# Patient Record
Sex: Female | Born: 1940 | Race: White | Hispanic: No | Smoking: Former smoker
Health system: Southern US, Community
[De-identification: ages and names within clinical notes are randomized; demographics above are authoritative.]

## PROBLEM LIST (undated history)

## (undated) DIAGNOSIS — C801 Malignant (primary) neoplasm, unspecified: Secondary | ICD-10-CM

## (undated) DIAGNOSIS — I10 Essential (primary) hypertension: Secondary | ICD-10-CM

## (undated) DIAGNOSIS — Z9889 Other specified postprocedural states: Secondary | ICD-10-CM

## (undated) DIAGNOSIS — R112 Nausea with vomiting, unspecified: Secondary | ICD-10-CM

## (undated) HISTORY — PX: ABDOMINAL HYSTERECTOMY: SHX81

## (undated) HISTORY — DX: Essential (primary) hypertension: I10

---

## 2005-04-01 ENCOUNTER — Ambulatory Visit: Payer: Self-pay | Admitting: Internal Medicine

## 2006-05-10 ENCOUNTER — Ambulatory Visit: Payer: Self-pay | Admitting: Internal Medicine

## 2007-05-17 ENCOUNTER — Ambulatory Visit: Payer: Self-pay | Admitting: Internal Medicine

## 2008-05-21 ENCOUNTER — Ambulatory Visit: Payer: Self-pay | Admitting: Internal Medicine

## 2009-06-04 ENCOUNTER — Ambulatory Visit: Payer: Self-pay | Admitting: Internal Medicine

## 2010-06-26 ENCOUNTER — Ambulatory Visit: Payer: Self-pay | Admitting: Internal Medicine

## 2011-07-08 ENCOUNTER — Ambulatory Visit: Payer: Self-pay | Admitting: Family Medicine

## 2012-03-03 ENCOUNTER — Emergency Department: Payer: Self-pay | Admitting: Emergency Medicine

## 2012-03-03 LAB — URINALYSIS, COMPLETE
Bacteria: NONE SEEN
Bilirubin,UR: NEGATIVE
Glucose,UR: NEGATIVE mg/dL (ref 0–75)
Nitrite: NEGATIVE
Ph: 6 (ref 4.5–8.0)
RBC,UR: 324 /HPF (ref 0–5)
Specific Gravity: 1.019 (ref 1.003–1.030)
WBC UR: 1 /HPF (ref 0–5)

## 2012-03-03 LAB — COMPREHENSIVE METABOLIC PANEL
Alkaline Phosphatase: 54 U/L (ref 50–136)
Anion Gap: 8 (ref 7–16)
Bilirubin,Total: 0.3 mg/dL (ref 0.2–1.0)
Calcium, Total: 9 mg/dL (ref 8.5–10.1)
Chloride: 106 mmol/L (ref 98–107)
Creatinine: 1.1 mg/dL (ref 0.60–1.30)
EGFR (African American): 59 — ABNORMAL LOW
EGFR (Non-African Amer.): 51 — ABNORMAL LOW
Glucose: 155 mg/dL — ABNORMAL HIGH (ref 65–99)
Osmolality: 286 (ref 275–301)
SGOT(AST): 21 U/L (ref 15–37)
Sodium: 141 mmol/L (ref 136–145)
Total Protein: 7.2 g/dL (ref 6.4–8.2)

## 2012-03-03 LAB — CBC
MCH: 31.3 pg (ref 26.0–34.0)
MCHC: 32.9 g/dL (ref 32.0–36.0)
Platelet: 174 10*3/uL (ref 150–440)
RBC: 4.17 10*6/uL (ref 3.80–5.20)
WBC: 8.9 10*3/uL (ref 3.6–11.0)

## 2012-07-10 ENCOUNTER — Ambulatory Visit: Payer: Self-pay | Admitting: Family Medicine

## 2013-03-14 ENCOUNTER — Ambulatory Visit: Payer: Self-pay | Admitting: Vascular Surgery

## 2013-10-18 ENCOUNTER — Ambulatory Visit: Payer: Self-pay | Admitting: Family Medicine

## 2014-11-05 ENCOUNTER — Ambulatory Visit: Payer: Self-pay | Admitting: Family Medicine

## 2015-10-21 ENCOUNTER — Other Ambulatory Visit: Payer: Self-pay | Admitting: Family Medicine

## 2015-10-21 DIAGNOSIS — Z1231 Encounter for screening mammogram for malignant neoplasm of breast: Secondary | ICD-10-CM

## 2015-11-07 ENCOUNTER — Ambulatory Visit: Payer: Self-pay

## 2015-11-12 ENCOUNTER — Other Ambulatory Visit: Payer: Self-pay | Admitting: Family Medicine

## 2015-11-12 ENCOUNTER — Ambulatory Visit
Admission: RE | Admit: 2015-11-12 | Discharge: 2015-11-12 | Disposition: A | Discharge status: Home / Self Care | Payer: Medicare Other | Source: Ambulatory Visit | Attending: Family Medicine | Admitting: Family Medicine

## 2015-11-12 DIAGNOSIS — Z1231 Encounter for screening mammogram for malignant neoplasm of breast: Secondary | ICD-10-CM | POA: Diagnosis present

## 2016-11-08 ENCOUNTER — Other Ambulatory Visit: Payer: Self-pay | Admitting: Family Medicine

## 2016-11-08 DIAGNOSIS — Z1231 Encounter for screening mammogram for malignant neoplasm of breast: Secondary | ICD-10-CM

## 2016-12-03 ENCOUNTER — Ambulatory Visit
Admission: RE | Admit: 2016-12-03 | Discharge: 2016-12-03 | Disposition: A | Discharge status: Home / Self Care | Payer: Medicare Other | Source: Ambulatory Visit | Attending: Family Medicine | Admitting: Family Medicine

## 2016-12-03 DIAGNOSIS — Z1231 Encounter for screening mammogram for malignant neoplasm of breast: Secondary | ICD-10-CM

## 2017-05-02 ENCOUNTER — Other Ambulatory Visit (INDEPENDENT_AMBULATORY_CARE_PROVIDER_SITE_OTHER): Payer: Self-pay | Admitting: Vascular Surgery

## 2017-05-02 DIAGNOSIS — I728 Aneurysm of other specified arteries: Secondary | ICD-10-CM

## 2017-05-03 ENCOUNTER — Encounter (INDEPENDENT_AMBULATORY_CARE_PROVIDER_SITE_OTHER): Payer: Self-pay | Admitting: Vascular Surgery

## 2017-05-03 ENCOUNTER — Ambulatory Visit (INDEPENDENT_AMBULATORY_CARE_PROVIDER_SITE_OTHER): Payer: Medicare Other | Admitting: Vascular Surgery

## 2017-05-03 ENCOUNTER — Ambulatory Visit (INDEPENDENT_AMBULATORY_CARE_PROVIDER_SITE_OTHER): Payer: Medicare Other

## 2017-05-03 VITALS — BP 161/82 | HR 62 | Resp 17 | Wt 148.8 lb

## 2017-05-03 DIAGNOSIS — I10 Essential (primary) hypertension: Secondary | ICD-10-CM

## 2017-05-03 DIAGNOSIS — I728 Aneurysm of other specified arteries: Secondary | ICD-10-CM

## 2017-05-03 NOTE — Assessment & Plan Note (Signed)
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 This has been stable and she is doing well. She is beyond childbearing years. No intervention is required for the small splenic artery aneurysm at this time. I'm going to lengthen her follow up to 18 months with duplex.

## 2017-05-03 NOTE — Assessment & Plan Note (Signed)
blood pressure control important in reducing the progression of atherosclerotic disease and aneurysmal degeneration. On appropriate oral medications.  

## 2017-05-03 NOTE — Progress Notes (Signed)
MRN : [id number]  Courtney Jefferson is a 76 y.o. (Apr 14, 1941) female who presents with chief complaint of  Chief Complaint  Patient presents with  . Follow-up  .  History of Present Illness: Patient returns today in follow up of Splenic artery aneurysm. She is doing well without complaints today. She denies abdominal pain, back pain, or other worrisome symptoms. 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  Current Outpatient Prescriptions  Medication Sig Dispense Refill  . BLACK COHOSH PO Take by mouth.    . calcium carbonate (OS-CAL) 600 MG TABS tablet Take by mouth.    . cholecalciferol (VITAMIN D) 1000 units tablet Take 1,000 Units by mouth daily.    Marland Kitchen triamterene-hydrochlorothiazide (MAXZIDE-25) 37.5-25 MG tablet TAKE 1 TABLET BY MOUTH ONCE A DAY     No current facility-administered medications for this visit.     Past Medical History:  Diagnosis Date  . Hypertension     Past Surgical History:  Procedure Laterality Date  . ABDOMINAL HYSTERECTOMY     partial    Social History Social History  Substance Use Topics  . Smoking status: Former Research scientist (life sciences)  . Smokeless tobacco: Never Used  . Alcohol use No    Family History No bleeding or clotting disorders  No Known Allergies   REVIEW OF SYSTEMS (Negative unless checked)  Constitutional: [] Weight loss  [] Fever  [] Chills Cardiac: [] Chest pain   [] Chest pressure   [] Palpitations   [] Shortness of breath when laying flat   [] Shortness of breath at rest   [] Shortness of breath with exertion. Vascular:  [] Pain in legs with walking   [] Pain in legs at rest   [] Pain in legs when laying flat   [] Claudication   [] Pain in feet when walking  [] Pain in feet at rest  [] Pain in feet when laying flat   [] History of DVT   [] Phlebitis   [] Swelling in legs   [] Varicose veins   [] Non-healing ulcers Pulmonary:    [] Uses home oxygen   [] Productive cough   [] Hemoptysis   [] Wheeze  [] COPD   [] Asthma Neurologic:  [] Dizziness  [] Blackouts   [] Seizures   [] History of stroke   [] History of TIA  [] Aphasia   [] Temporary blindness   [] Dysphagia   [] Weakness or numbness in arms   [] Weakness or numbness in legs Musculoskeletal:  [x] Arthritis   [] Joint swelling   [] Joint pain   [] Low back pain Hematologic:  [] Easy bruising  [] Easy bleeding   [] Hypercoagulable state   [] Anemic   Gastrointestinal:  [] Blood in stool   [] Vomiting blood  [] Gastroesophageal reflux/heartburn   [] Abdominal pain Genitourinary:  [] Chronic kidney disease   [] Difficult urination  [] Frequent urination  [] Burning with urination   [] Hematuria Skin:  [] Rashes   [] Ulcers   [] Wounds Psychological:  [] History of anxiety   []  History of major depression.  Physical Examination  BP (!) 161/82   Pulse 62   Resp 17   Wt 148 lb 12.8 oz (67.5 kg)  Gen:  WD/WN, NAD Head: [place]/AT, No temporalis wasting. Ear/Nose/Throat: Hearing grossly intact, nares w/o erythema or drainage, trachea midline Eyes: Conjunctiva clear. Sclera non-icteric Neck: Supple.  No JVD.  Pulmonary:  Good air movement, no use of accessory muscles.  Cardiac: RRR, normal S1, S2 Vascular:  Vessel Right Left  Radial Palpable Palpable  Gastrointestinal: soft, non-tender/non-distended. No guarding/reflex.  Musculoskeletal: M/S 5/5 throughout.  No deformity or atrophy.  Neurologic: Sensation grossly intact in extremities.  Symmetrical.  Speech is fluent.  Psychiatric: Judgment intact, Mood & affect appropriate for pt's clinical situation. Dermatologic: No rashes or ulcers noted.  No cellulitis or open wounds.       Labs No results found for this or any previous visit (from the past 2160 hour(s)).  Radiology No results found.  Assessment/Plan  Hypertension blood pressure control important in reducing the progression of atherosclerotic  disease and aneurysmal degeneration. On appropriate oral medications.   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 This has been stable and she is doing well. She is beyond childbearing years. No intervention is required for the small splenic artery aneurysm at this time. I'm going to lengthen her follow up to 18 months with duplex.    Leotis Pain, MD  05/03/2017 8:51 AM    This note was created with Dragon medical transcription system.  Any errors from dictation are purely unintentional

## 2017-12-21 ENCOUNTER — Other Ambulatory Visit: Payer: Self-pay | Admitting: Family Medicine

## 2017-12-21 DIAGNOSIS — Z1231 Encounter for screening mammogram for malignant neoplasm of breast: Secondary | ICD-10-CM

## 2018-01-05 ENCOUNTER — Ambulatory Visit
Admission: RE | Admit: 2018-01-05 | Discharge: 2018-01-05 | Disposition: A | Discharge status: Home / Self Care | Payer: Medicare Other | Source: Ambulatory Visit | Attending: Family Medicine | Admitting: Family Medicine

## 2018-01-05 DIAGNOSIS — Z1231 Encounter for screening mammogram for malignant neoplasm of breast: Secondary | ICD-10-CM | POA: Diagnosis not present

## 2018-03-15 ENCOUNTER — Emergency Department
Admission: EM | Admit: 2018-03-15 | Discharge: 2018-03-15 | Disposition: A | Discharge status: Home / Self Care | Payer: Medicare Other | Attending: Emergency Medicine | Admitting: Emergency Medicine

## 2018-03-15 ENCOUNTER — Emergency Department: Payer: Medicare Other

## 2018-03-15 ENCOUNTER — Other Ambulatory Visit: Payer: Self-pay

## 2018-03-15 ENCOUNTER — Encounter: Payer: Self-pay | Admitting: Emergency Medicine

## 2018-03-15 DIAGNOSIS — B3731 Acute candidiasis of vulva and vagina: Secondary | ICD-10-CM

## 2018-03-15 DIAGNOSIS — B373 Candidiasis of vulva and vagina: Secondary | ICD-10-CM | POA: Insufficient documentation

## 2018-03-15 DIAGNOSIS — I10 Essential (primary) hypertension: Secondary | ICD-10-CM | POA: Diagnosis not present

## 2018-03-15 DIAGNOSIS — N201 Calculus of ureter: Secondary | ICD-10-CM | POA: Diagnosis not present

## 2018-03-15 DIAGNOSIS — Z79899 Other long term (current) drug therapy: Secondary | ICD-10-CM | POA: Insufficient documentation

## 2018-03-15 DIAGNOSIS — R109 Unspecified abdominal pain: Secondary | ICD-10-CM

## 2018-03-15 DIAGNOSIS — Z87891 Personal history of nicotine dependence: Secondary | ICD-10-CM | POA: Insufficient documentation

## 2018-03-15 LAB — URINALYSIS, COMPLETE (UACMP) WITH MICROSCOPIC
BILIRUBIN URINE: NEGATIVE
Glucose, UA: NEGATIVE mg/dL
Ketones, ur: NEGATIVE mg/dL
Leukocytes, UA: NEGATIVE
Nitrite: NEGATIVE
PROTEIN: NEGATIVE mg/dL
RBC / HPF: >50 RBC/hpf — ABNORMAL HIGH (ref 0–5)
SPECIFIC GRAVITY, URINE: 1.032 — AB (ref 1.005–1.030)
WBC UA: NONE SEEN WBC/hpf (ref 0–5)
pH: 5 (ref 5.0–8.0)

## 2018-03-15 LAB — CBC WITH DIFFERENTIAL/PLATELET
BASOS ABS: 0.1 10*3/uL (ref 0–0.1)
Basophils Relative: 1 %
EOS PCT: 1 %
Eosinophils Absolute: 0.1 10*3/uL (ref 0–0.7)
HCT: 40.6 % (ref 35.0–47.0)
HEMOGLOBIN: 13.8 g/dL (ref 12.0–16.0)
LYMPHS ABS: 1.3 10*3/uL (ref 1.0–3.6)
Lymphocytes Relative: 17 %
MCH: 31.8 pg (ref 26.0–34.0)
MCHC: 34.1 g/dL (ref 32.0–36.0)
MCV: 93.3 fL (ref 80.0–100.0)
Monocytes Absolute: 0.4 10*3/uL (ref 0.2–0.9)
Monocytes Relative: 5 %
NEUTROS PCT: 76 %
Neutro Abs: 6.1 10*3/uL (ref 1.4–6.5)
PLATELETS: 169 10*3/uL (ref 150–440)
RBC: 4.35 MIL/uL (ref 3.80–5.20)
RDW: 13.9 % (ref 11.5–14.5)
WBC: 8 10*3/uL (ref 3.6–11.0)

## 2018-03-15 LAB — WET PREP, GENITAL
Clue Cells Wet Prep HPF POC: NONE SEEN
SPERM: NONE SEEN
Trich, Wet Prep: NONE SEEN
Yeast Wet Prep HPF POC: NONE SEEN

## 2018-03-15 LAB — COMPREHENSIVE METABOLIC PANEL
ALT: 18 U/L (ref 14–54)
AST: 21 U/L (ref 15–41)
Albumin: 3.9 g/dL (ref 3.5–5.0)
Alkaline Phosphatase: 51 U/L (ref 38–126)
Anion gap: 8 (ref 5–15)
BUN: 22 mg/dL — AB (ref 6–20)
CALCIUM: 9 mg/dL (ref 8.9–10.3)
CHLORIDE: 107 mmol/L (ref 101–111)
CO2: 23 mmol/L (ref 22–32)
CREATININE: 1.07 mg/dL — AB (ref 0.44–1.00)
GFR calc non Af Amer: 49 mL/min — ABNORMAL LOW (ref >60)
GFR, EST AFRICAN AMERICAN: 57 mL/min — AB (ref >60)
GLUCOSE: 144 mg/dL — AB (ref 65–99)
Potassium: 3.6 mmol/L (ref 3.5–5.1)
SODIUM: 138 mmol/L (ref 135–145)
Total Bilirubin: 0.4 mg/dL (ref 0.3–1.2)
Total Protein: 7.5 g/dL (ref 6.5–8.1)

## 2018-03-15 LAB — LIPASE, BLOOD: Lipase: 42 U/L (ref 11–51)

## 2018-03-15 MED ORDER — MICONAZOLE NITRATE 1200 & 2 MG & % VA KIT: 1.0000 | PACK | Freq: Once | VAGINAL | 0 refills | Status: AC

## 2018-03-15 MED ORDER — KETOROLAC TROMETHAMINE 60 MG/2ML IM SOLN
INTRAMUSCULAR | Status: AC
  Administered 2018-03-15: 15 mg via INTRAMUSCULAR
  Filled 2018-03-15: qty 2

## 2018-03-15 MED ORDER — TAMSULOSIN HCL 0.4 MG PO CAPS
0.4000 mg | ORAL_CAPSULE | ORAL | Status: AC
  Administered 2018-03-15: 0.4 mg via ORAL
  Filled 2018-03-15: qty 1

## 2018-03-15 MED ORDER — ONDANSETRON 4 MG PO TBDP
4.0000 mg | ORAL_TABLET | Freq: Once | ORAL | Status: AC | PRN
  Administered 2018-03-15: 4 mg via ORAL

## 2018-03-15 MED ORDER — ONDANSETRON 4 MG PO TBDP
ORAL_TABLET | ORAL | Status: AC
  Filled 2018-03-15: qty 1

## 2018-03-15 MED ORDER — KETOROLAC TROMETHAMINE 60 MG/2ML IM SOLN
15.0000 mg | Freq: Once | INTRAMUSCULAR | Status: AC
  Administered 2018-03-15: 15 mg via INTRAMUSCULAR

## 2018-03-15 MED ORDER — ONDANSETRON 4 MG PO TBDP: 4.0000 mg | ORAL_TABLET | Freq: Three times a day (TID) | ORAL | 0 refills | Status: DC | PRN

## 2018-03-15 MED ORDER — ACETAMINOPHEN 325 MG PO TABS: 650.0000 mg | ORAL_TABLET | Freq: Four times a day (QID) | ORAL | 0 refills | Status: DC | PRN

## 2018-03-15 NOTE — ED Notes (Signed)
Patient transported to CT 

## 2018-03-15 NOTE — ED Triage Notes (Signed)
Pt arrived with family with complaints of left flank pain and left lower abdominal pain. Pt states pain started today at 0400 and has been nauseated with 1 episode of emesis. Family/pt concerned because of patients history of splenic aneurism. Pt was told by primary doctor to come to ED if she ever had abdominal pain to be evaluated.

## 2018-03-15 NOTE — ED Provider Notes (Signed)
[facility] Emergency Department Provider Note  ____________________________________________  Time seen: Approximately 12:23 PM  I have reviewed the triage vital signs and the nursing notes.   HISTORY  Chief Complaint Abdominal Pain and Flank Pain    HPI Courtney Jefferson is a 77 y.o. female with a history of hypertension and splenic artery aneurysm who complains of left flank pain radiating to the left lower quadrant that started at 4 AM today.  Intermittent episodes lasting a few minutes at a time, no aggravating or alleviating factors, severe and associated with nausea when present.  Feels sharp.  Currently resolved.  Also complains of vaginal irritation which is a recurrent issue for her, previously treated with nystatin cream which was previously effective but currently not helping.      Past Medical History:  Diagnosis Date  . Hypertension      Patient Active Problem List   Diagnosis Date Noted  . Hypertension 05/03/2017  . Splenic artery aneurysm ([place]) 05/03/2017     Past Surgical History:  Procedure Laterality Date  . ABDOMINAL HYSTERECTOMY     partial     Prior to Admission medications   Medication Sig Start Date End Date Taking? Authorizing Provider  acetaminophen (TYLENOL) 325 MG tablet Take 2 tablets (650 mg total) by mouth every 6 (six) hours as needed. 03/15/18   Carrie Mew, MD  BLACK COHOSH PO Take by mouth.    [provider]  calcium carbonate (OS-CAL) 600 MG TABS tablet Take by mouth.    [provider]  cholecalciferol (VITAMIN D) 1000 units tablet Take 1,000 Units by mouth daily.    [provider]  miconazole (MONISTAT 1 COMBO PACK) kit Place 1 each vaginally once for 1 dose. 03/15/18 03/15/18  Carrie Mew, MD  ondansetron (ZOFRAN ODT) 4 MG disintegrating tablet Take 1 tablet (4 mg total) by mouth every 8 (eight) hours as needed for nausea or vomiting. 03/15/18   Carrie Mew, MD   triamterene-hydrochlorothiazide (MAXZIDE-25) 37.5-25 MG tablet TAKE 1 TABLET BY MOUTH ONCE A DAY 12/06/16   [provider]     Allergies Patient has no known allergies.   Family History  Problem Relation Age of Onset  . Diabetes Sister     Social History Social History   Tobacco Use  . Smoking status: Former Research scientist (life sciences)  . Smokeless tobacco: Never Used  Substance Use Topics  . Alcohol use: No  . Drug use: No    Review of Systems  Constitutional:   No fever or chills.  Cardiovascular:   No chest pain or syncope. Respiratory:   No dyspnea or cough. Gastrointestinal:   Positive as above for left flank pain without vomiting and diarrhea.  No dysuria or hematuria Musculoskeletal:   Negative for focal pain or swelling All other systems reviewed and are negative except as documented above in ROS and HPI.  ____________________________________________   PHYSICAL EXAM:  VITAL SIGNS: ED Triage Vitals  Enc Vitals Group     BP 03/15/18 0815 (!) 157/91     Pulse Rate 03/15/18 0815 85     Resp 03/15/18 0815 18     Temp 03/15/18 0815 97.6 F (36.4 C)     Temp Source 03/15/18 0815 Oral     SpO2 03/15/18 0815 99 %     Weight 03/15/18 0816 146 lb (66.2 kg)     Height 03/15/18 0816 _0  (1.6 m)     Head Circumference --      Peak  Flow --      Pain Score 03/15/18 0816 4     Pain Loc --      Pain Edu? --      Excl. in [place]? --     Vital signs reviewed, nursing assessments reviewed.   Constitutional:   Alert and oriented. Non-toxic appearance. Eyes:   Conjunctivae are normal. EOMI. PERRL. ENT      Head:   Normocephalic and atraumatic.      Nose:   No congestion/rhinnorhea.       Mouth/Throat:   MMM, no pharyngeal erythema. No peritonsillar mass.       Neck:   No meningismus. Full ROM. Hematological/Lymphatic/Immunilogical:   No cervical lymphadenopathy. Cardiovascular:   RRR. Symmetric bilateral radial and DP pulses.  No murmurs.  Respiratory:   Normal respiratory  effort without tachypnea/retractions. Breath sounds are clear and equal bilaterally. No wheezes/rales/rhonchi. Gastrointestinal:   Soft and nontender. Non distended. There is no CVA tenderness.  No rebound, rigidity, or guarding. Genitourinary:   Diffuse vulvar irritation and redness.  Speculum exam shows chunky white discharge and irritated mucosa Musculoskeletal:   Normal range of motion in all extremities. No joint effusions.  No lower extremity tenderness.  No edema. Neurologic:   Normal speech and language.  Motor grossly intact. No acute focal neurologic deficits are appreciated.  Skin:    Skin is warm, dry and intact. No rash noted.  No petechiae, purpura, or bullae.  ____________________________________________    LABS (pertinent positives/negatives) (all labs ordered are listed, but only abnormal results are displayed) Labs Reviewed  URINALYSIS, COMPLETE (UACMP) WITH MICROSCOPIC - Abnormal; Notable for the following components:      Result Value   Color, Urine YELLOW (*)    APPearance HAZY (*)    Specific Gravity, Urine 1.032 (*)    Hgb urine dipstick LARGE (*)    RBC / HPF >50 (*)    Bacteria, UA FEW (*)    All other components within normal limits  COMPREHENSIVE METABOLIC PANEL - Abnormal; Notable for the following components:   Glucose, Bld 144 (*)    BUN 22 (*)    Creatinine, Ser 1.07 (*)    GFR calc non Af Amer 49 (*)    GFR calc Af Amer 57 (*)    All other components within normal limits  WET PREP, GENITAL  CBC WITH DIFFERENTIAL/PLATELET  LIPASE, BLOOD   ____________________________________________   EKG    ____________________________________________    RADIOLOGY  Ct Renal Stone Study  Result Date: 03/15/2018 CLINICAL DATA:  Left flank pain, left lower abdominal pain EXAM: CT ABDOMEN AND PELVIS WITHOUT CONTRAST TECHNIQUE: Multidetector CT imaging of the abdomen and pelvis was performed following the standard protocol without IV contrast. COMPARISON:   03/14/2013 FINDINGS: Lower chest: No acute abnormality. Hepatobiliary: No focal hepatic abnormality. Gallbladder unremarkable. Pancreas: No focal abnormality or ductal dilatation. Spleen: No focal abnormality.  Normal size. Adrenals/Urinary Tract: There is mild left hydronephrosis and perinephric stranding due to a 3 mm mid left ureteral stone just above the pelvic brim. No stones or hydronephrosis on the right. Adrenal glands are unremarkable. Urinary bladder decompressed. Stomach/Bowel: There is a area of mild wall thickening within the distal descending colon. Slight haziness noted around this segment of: With small adjacent lymph nodes. Appendix is normal. Stomach and small bowel decompressed, unremarkable. Vascular/Lymphatic: No evidence of aneurysm or adenopathy. Densely calcified structure is again noted in the left upper quadrant adjacent to the splenic artery, measuring up to 3.1  cm, stable since 2014. 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 Reproductive: Prior hysterectomy.  No adnexal masses. Other: No free fluid or free air. Haziness noted throughout the central mesentery with mildly prominent mesenteric lymph nodes. This is stable since 2014. Musculoskeletal: No acute bony abnormality. IMPRESSION: 3 mm mid left ureteral stone with mild left hydronephrosis and perinephric stranding. Area of apparent circumferential wall thickening within the distal descending colon. There is slight haziness/inflammation in the adjacent mesentery with adjacent mesenteric lymph nodes. While this could reflect an area of focal colitis/diverticulitis, I do not see in the diverticula in the area, and I cannot completely exclude early annular colon cancer. Recommend further evaluation with colonoscopy if the patient has not had a recent colonoscopy. Stable densely calcified structure in the left upper quadrant which could reflect a calcified splenic artery aneurysm,  unchanged since 2014. Stable haziness within the central mesentery since 2014 compatible with a benign process. Electronically Signed   By: Rolm Baptise M.D.   On: 03/15/2018 10:56    ____________________________________________   PROCEDURES Procedures  ____________________________________________  DIFFERENTIAL DIAGNOSIS   Renal colic, diverticulitis.  Doubt symptomatic aneurysm or rupture.  Doubt AAA or dissection  CLINICAL IMPRESSION / ASSESSMENT AND PLAN / ED COURSE  Pertinent labs & imaging results that were available during my care of the patient were reviewed by me and considered in my medical decision making (see chart for details).      Clinical Course as of Mar 15 1222  Wed Mar 15, 2018  1015 Patient currently symptom-free.  Intermittent severe sharp symptoms consistent with previous episode of kidney stone, has microscopic hematuria.  Highly likely to be kidney stone.  I doubt that this is related to her recent history of splenic artery aneurysm given the intermittent nature of her pain that is currently resolved, the diagnosis of which appeared to not be definitive on the original CT angiogram anyway.   [PS]  1106 CT scan shows a 3 mm mid ureteral stone consistent with her presentation.  Also shows thickening In the distal descending colon, concerning for possible dysplasia.    [PS]  2883 Pt updated on results. Advised to see GI to consider colonoscopy   [PS]    Clinical Course User Index [PS] Carrie Mew, MD     ____________________________________________   FINAL CLINICAL IMPRESSION(S) / ED DIAGNOSES    Final diagnoses:  Left flank pain  Ureterolithiasis  Vaginal candidiasis     ED Discharge Orders        Ordered    acetaminophen (TYLENOL) 325 MG tablet  Every 6 hours PRN     03/15/18 1218    ondansetron (ZOFRAN ODT) 4 MG disintegrating tablet  Every 8 hours PRN     03/15/18 1218    miconazole (MONISTAT 1 COMBO PACK) kit   Once     03/15/18  1221      Portions of this note were generated with dragon dictation software. Dictation errors may occur despite best attempts at proofreading.     Carrie Mew, MD 03/15/18 1225

## 2018-03-17 ENCOUNTER — Other Ambulatory Visit: Payer: Self-pay

## 2018-03-17 ENCOUNTER — Emergency Department: Payer: Medicare Other

## 2018-03-17 ENCOUNTER — Emergency Department
Admission: EM | Admit: 2018-03-17 | Discharge: 2018-03-17 | Disposition: A | Discharge status: Home / Self Care | Payer: Medicare Other | Attending: Emergency Medicine | Admitting: Emergency Medicine

## 2018-03-17 ENCOUNTER — Encounter: Payer: Self-pay | Admitting: Emergency Medicine

## 2018-03-17 DIAGNOSIS — Z79899 Other long term (current) drug therapy: Secondary | ICD-10-CM | POA: Diagnosis not present

## 2018-03-17 DIAGNOSIS — N200 Calculus of kidney: Secondary | ICD-10-CM | POA: Diagnosis not present

## 2018-03-17 DIAGNOSIS — I10 Essential (primary) hypertension: Secondary | ICD-10-CM | POA: Insufficient documentation

## 2018-03-17 DIAGNOSIS — Z87891 Personal history of nicotine dependence: Secondary | ICD-10-CM | POA: Diagnosis not present

## 2018-03-17 DIAGNOSIS — R109 Unspecified abdominal pain: Secondary | ICD-10-CM | POA: Diagnosis present

## 2018-03-17 LAB — URINALYSIS, COMPLETE (UACMP) WITH MICROSCOPIC
Bilirubin Urine: NEGATIVE
GLUCOSE, UA: NEGATIVE mg/dL
KETONES UR: 5 mg/dL — AB
Nitrite: NEGATIVE
PROTEIN: NEGATIVE mg/dL
Specific Gravity, Urine: 1.028 (ref 1.005–1.030)
pH: 5 (ref 5.0–8.0)

## 2018-03-17 MED ORDER — MORPHINE SULFATE (PF) 2 MG/ML IV SOLN
2.0000 mg | Freq: Once | INTRAVENOUS | Status: AC
  Administered 2018-03-17: 2 mg via INTRAVENOUS

## 2018-03-17 MED ORDER — TAMSULOSIN HCL 0.4 MG PO CAPS
0.4000 mg | ORAL_CAPSULE | Freq: Every day | ORAL | 0 refills | Status: DC
Start: 2018-03-17 — End: 2019-08-17

## 2018-03-17 MED ORDER — CEPHALEXIN 500 MG PO CAPS: 500.0000 mg | ORAL_CAPSULE | Freq: Two times a day (BID) | ORAL | 0 refills | Status: AC

## 2018-03-17 MED ORDER — TAMSULOSIN HCL 0.4 MG PO CAPS
0.4000 mg | ORAL_CAPSULE | Freq: Once | ORAL | Status: AC
  Administered 2018-03-17: 0.4 mg via ORAL
  Filled 2018-03-17: qty 1

## 2018-03-17 MED ORDER — KETOROLAC TROMETHAMINE 30 MG/ML IJ SOLN
15.0000 mg | Freq: Once | INTRAMUSCULAR | Status: AC
  Administered 2018-03-17: 15 mg via INTRAVENOUS

## 2018-03-17 MED ORDER — SODIUM CHLORIDE 0.9 % IV SOLN
1.0000 g | Freq: Once | INTRAVENOUS | Status: AC
  Administered 2018-03-17: 1 g via INTRAVENOUS
  Filled 2018-03-17: qty 10

## 2018-03-17 MED ORDER — KETOROLAC TROMETHAMINE 30 MG/ML IJ SOLN
INTRAMUSCULAR | Status: AC
  Filled 2018-03-17: qty 1

## 2018-03-17 MED ORDER — MORPHINE SULFATE (PF) 2 MG/ML IV SOLN
INTRAVENOUS | Status: AC
  Filled 2018-03-17: qty 1

## 2018-03-17 MED ORDER — SODIUM CHLORIDE 0.9 % IV BOLUS
1000.0000 mL | Freq: Once | INTRAVENOUS | Status: AC
  Administered 2018-03-17: 1000 mL via INTRAVENOUS

## 2018-03-17 MED ORDER — ONDANSETRON HCL 4 MG/2ML IJ SOLN
4.0000 mg | Freq: Once | INTRAMUSCULAR | Status: AC
  Administered 2018-03-17: 4 mg via INTRAVENOUS

## 2018-03-17 MED ORDER — ONDANSETRON HCL 4 MG/2ML IJ SOLN
INTRAMUSCULAR | Status: AC
  Filled 2018-03-17: qty 2

## 2018-03-17 MED ORDER — OXYCODONE-ACETAMINOPHEN 5-325 MG PO TABS: 1.0000 | ORAL_TABLET | ORAL | 0 refills | Status: DC | PRN

## 2018-03-17 NOTE — ED Triage Notes (Signed)
Patient ambulatory to triage with steady gait, without difficulty or distress noted; pt reports left lower abd pain, nonradiating; st seen Wed and dx with kidney stone; st hx of same

## 2018-03-17 NOTE — ED Provider Notes (Signed)
[facility] Emergency Department Provider Note   First MD Initiated Contact with Patient 03/17/18 [phone]     (approximate)  I have reviewed the triage vital signs and the nursing notes.   HISTORY  Chief Complaint Abdominal Pain   HPI Courtney Jefferson is a 77 y.o. female with recently diagnosed left kidney stone on 03/15/2018 return to the emergency department with 10 out of 10 left flank pain which began tonight before arrival.  Patient denies any fever.  Patient denies any vomiting.   Past Medical History:  Diagnosis Date  . Hypertension     Patient Active Problem List   Diagnosis Date Noted  . Hypertension 05/03/2017  . Splenic artery aneurysm ([place]) 05/03/2017    Past Surgical History:  Procedure Laterality Date  . ABDOMINAL HYSTERECTOMY     partial    Prior to Admission medications   Medication Sig Start Date End Date Taking? Authorizing Provider  acetaminophen (TYLENOL) 325 MG tablet Take 2 tablets (650 mg total) by mouth every 6 (six) hours as needed. 03/15/18   Carrie Mew, MD  BLACK COHOSH PO Take by mouth.    [provider]  calcium carbonate (OS-CAL) 600 MG TABS tablet Take by mouth.    [provider]  cephALEXin (KEFLEX) 500 MG capsule Take 1 capsule (500 mg total) by mouth 2 (two) times daily for 10 days. 03/17/18 03/27/18  Gregor Hams, MD  cholecalciferol (VITAMIN D) 1000 units tablet Take 1,000 Units by mouth daily.    [provider]  ondansetron (ZOFRAN ODT) 4 MG disintegrating tablet Take 1 tablet (4 mg total) by mouth every 8 (eight) hours as needed for nausea or vomiting. 03/15/18   Carrie Mew, MD  oxyCODONE-acetaminophen (PERCOCET) 5-325 MG tablet Take 1 tablet by mouth every 4 (four) hours as needed for severe pain. 03/17/18 03/17/19  Gregor Hams, MD  tamsulosin [facility]) 0.4 MG CAPS capsule Take 1 capsule (0.4 mg total) by mouth daily after breakfast. 03/17/18   Gregor Hams, MD   triamterene-hydrochlorothiazide (MAXZIDE-25) 37.5-25 MG tablet TAKE 1 TABLET BY MOUTH ONCE A DAY 12/06/16   [provider]    Allergies No known drug allergies  Family History  Problem Relation Age of Onset  . Diabetes Sister     Social History Social History   Tobacco Use  . Smoking status: Former Research scientist (life sciences)  . Smokeless tobacco: Never Used  Substance Use Topics  . Alcohol use: No  . Drug use: No    Review of Systems Constitutional: No fever/chills Eyes: No visual changes. ENT: No sore throat. Cardiovascular: Denies chest pain. Respiratory: Denies shortness of breath. Gastrointestinal: No abdominal pain.  No nausea, no vomiting.  No diarrhea.  No constipation. Genitourinary: Negative for dysuria. Musculoskeletal: Negative for neck pain.  Negative for back pain. Integumentary: Negative for rash. Neurological: Negative for headaches, focal weakness or numbness.   ____________________________________________   PHYSICAL EXAM:  VITAL SIGNS: ED Triage Vitals  Enc Vitals Group     BP 03/17/18 0530 (!) 172/81     Pulse Rate 03/17/18 0530 85     Resp 03/17/18 0530 20     Temp 03/17/18 0530 98.3 F (36.8 C)     Temp Source 03/17/18 0530 Oral     SpO2 03/17/18 0530 98 %     Weight 03/17/18 0520 66.2 kg (146 lb)     Height 03/17/18 0520 1.6 m (5\' 3" )     Head Circumference --  Peak Flow --      Pain Score 03/17/18 0520 8     Pain Loc --      Pain Edu? --      Excl. in [place]? --     Constitutional: Alert and oriented.  Apparent discomfort  Head: Atraumatic. Mouth/Throat: Mucous membranes are moist.  Oropharynx non-erythematous. Neck: No stridor.   Cardiovascular: Normal rate, regular rhythm. Good peripheral circulation. Grossly normal heart sounds. Respiratory: Normal respiratory effort.  No retractions. Lungs CTAB. Gastrointestinal: Soft and nontender. No distention.  Musculoskeletal: No lower extremity tenderness nor edema. No gross deformities of  extremities. Neurologic:  Normal speech and language. No gross focal neurologic deficits are appreciated.  Skin:  Skin is warm, dry and intact. No rash noted.   ____________________________________________   LABS (all labs ordered are listed, but only abnormal results are displayed)  Labs Reviewed  URINALYSIS, COMPLETE (UACMP) WITH MICROSCOPIC - Abnormal; Notable for the following components:      Result Value   Color, Urine YELLOW (*)    APPearance CLOUDY (*)    Hgb urine dipstick LARGE (*)    Ketones, ur 5 (*)    Leukocytes, UA SMALL (*)    Bacteria, UA MANY (*)    Non Squamous Epithelial 0-5 (*)    All other components within normal limits  CBC WITH DIFFERENTIAL/PLATELET  COMPREHENSIVE METABOLIC PANEL     RADIOLOGY I, Gardner N Sherree Shankman, personally viewed and evaluated these images (plain radiographs) as part of my medical decision making, as well as reviewing the written report by the radiologist.  ED MD interpretation: 3 mm calculus noted in the left hemipelvis near the left UVJ per the radiologist  Official radiology report(s): Dg Abdomen 1 View  Result Date: 03/17/2018 CLINICAL DATA:  77 year old female with left lower quadrant abdominal pain. Proximal left ureteral calculus diagnosed on CT 2 days ago. EXAM: ABDOMEN - 1 VIEW COMPARISON:  CT Abdomen and Pelvis 03/15/2018 and earlier. FINDINGS: Two supine views of the abdomen. Bulky left upper quadrant dystrophic calcification is stable. Lung bases appear negative. Non obstructed bowel gas pattern. The 3-4 millimeter calculus which was visible on the CT scout view at the L3-L4 level has travel distally and now projects in the left hemipelvis (arrow) at or near the left ureterovesical junction. The bladder appears decompressed. Other abdominal and pelvic visceral contours are normal. No acute osseous abnormality identified. IMPRESSION: 1. Left ureteral calculus now projects in the pelvis at or near the left UVJ. 2. Otherwise  stable abdomen. Electronically Signed   By: Genevie Ann M.D.   On: 03/17/2018 07:01      Procedures   ____________________________________________   INITIAL IMPRESSION / ASSESSMENT AND PLAN / ED COURSE  As part of my medical decision making, I reviewed the following data within the electronic MEDICAL RECORD NUMBER   77 year old female presenting with above-stated history and physical exam secondary to left flank pain with recently diagnosed kidney stone.  Patient given IV morphine Toradol and Zofran in the emergency department.  On reevaluation patient's pain is much improved.  Patient's urinalysis revealed many bacteria and small leukocytes with increased white count however multiple squamous cells also noted in the urine raising possibly a contaminated specimen.  Patient discussed with Dr. Lovena Neighbours urologist on call who recommended empiric antibiotic treatment with outpatient follow-up with urology following pain control. ____________________________________________  FINAL CLINICAL IMPRESSION(S) / ED DIAGNOSES  Final diagnoses:  Kidney stone on left side     MEDICATIONS GIVEN DURING THIS  VISIT:  Medications  tamsulosin (FLOMAX) capsule 0.4 mg (has no administration in time range)  cefTRIAXone (ROCEPHIN) 1 g in sodium chloride 0.9 % 100 mL IVPB (has no administration in time range)  morphine 2 MG/ML injection 2 mg (2 mg Intravenous Given 03/17/18 0553)  ondansetron (ZOFRAN) injection 4 mg (4 mg Intravenous Given 03/17/18 0551)  ketorolac (TORADOL) 30 MG/ML injection 15 mg (15 mg Intravenous Given 03/17/18 0555)  sodium chloride 0.9 % bolus 1,000 mL (1,000 mLs Intravenous New Bag/Given 03/17/18 0557)     ED Discharge Orders        Ordered    cephALEXin (KEFLEX) 500 MG capsule  2 times daily     03/17/18 0649    tamsulosin (FLOMAX) 0.4 MG CAPS capsule  Daily after breakfast     03/17/18 0650    oxyCODONE-acetaminophen (PERCOCET) 5-325 MG tablet  Every 4 hours PRN     03/17/18 0650        Note:  This document was prepared using Dragon voice recognition software and may include unintentional dictation errors.    Gregor Hams, MD 03/17/18 [phone]

## 2018-03-20 ENCOUNTER — Encounter: Payer: Self-pay | Admitting: Urology

## 2018-03-20 ENCOUNTER — Ambulatory Visit (INDEPENDENT_AMBULATORY_CARE_PROVIDER_SITE_OTHER): Payer: Medicare Other | Admitting: Urology

## 2018-03-20 VITALS — BP 152/79 | HR 80 | Ht 63.0 in | Wt 148.2 lb

## 2018-03-20 DIAGNOSIS — N201 Calculus of ureter: Secondary | ICD-10-CM

## 2018-03-20 LAB — URINALYSIS, COMPLETE
Bilirubin, UA: NEGATIVE
GLUCOSE, UA: NEGATIVE
Ketones, UA: NEGATIVE
Leukocytes, UA: NEGATIVE
NITRITE UA: NEGATIVE
PH UA: 5 (ref 5.0–7.5)
Protein, UA: NEGATIVE
Specific Gravity, UA: 1.02 (ref 1.005–1.030)
Urobilinogen, Ur: 0.2 mg/dL (ref 0.2–1.0)

## 2018-03-20 LAB — MICROSCOPIC EXAMINATION: WBC, UA: NONE SEEN /hpf (ref 0–5)

## 2018-03-20 NOTE — Progress Notes (Signed)
03/20/2018 8:54 AM   Courtney Jefferson 17-Mar-1941 [id number]  Referring provider: Dion Body, MD [facility] [place], [place] [address]  Chief Complaint  Patient presents with  . Hospitalization Follow-up    kidney stone, thinks she has passed stone    HPI: Courtney Jefferson is a 77 year old female who presented to the [facility] ED on 03/15/2018 with onset of left flank pain radiating to the left lower quadrant.  Her pain was intermittent without identifiable precipitating, aggravating or alleviating factors.  She had nausea without vomiting.  A stone protocol CT of the abdomen and pelvis was performed which showed a 3 mm left mid ureteral calculus with mild left hydronephrosis/hydroureter.  She was discharged on Tylenol and Zofran.  She return to the ED on 03/17/2018 with recurrent severe left flank pain and a KUB was performed which showed migration of the calculus to the vicinity of the left UVJ.  She was started on empiric antibiotic therapy and given tamsulosin and oxycodone.   She passed her stone over the weekend which she brings in today.  She is complaining of significant constipation and notes a small amount of blood on the toilet tissue when wiping after a bowel movement.  Her flank and abdominal pain have resolved.  She had a prior stone in 2012 which she passed.  Her CT did not show any additional calculi.   PMH: Past Medical History:  Diagnosis Date  . Hypertension     Surgical History: Past Surgical History:  Procedure Laterality Date  . ABDOMINAL HYSTERECTOMY     partial    Home Medications:  Allergies as of 03/20/2018   No Known Allergies     Medication List        Accurate as of 03/20/18  8:54 AM. Always use your most recent med list.          acetaminophen 325 MG tablet Commonly known as:  TYLENOL Take 2 tablets (650 mg total) by mouth every 6 (six) hours as needed.   BLACK COHOSH PO Take by mouth 2 (two) times  daily.   Calcium Carbonate-Vitamin D 600-400 MG-UNIT tablet Take by mouth.   cephALEXin 500 MG capsule Commonly known as:  KEFLEX Take 1 capsule (500 mg total) by mouth 2 (two) times daily for 10 days.   tamsulosin 0.4 MG Caps capsule Commonly known as:  FLOMAX Take 1 capsule (0.4 mg total) by mouth daily after breakfast.   triamterene-hydrochlorothiazide 37.5-25 MG tablet Commonly known as:  MAXZIDE-25 TAKE 1 TABLET BY MOUTH ONCE A DAY       Allergies: No Known Allergies  Family History: Family History  Problem Relation Age of Onset  . Diabetes Sister     Social History:  reports that she has quit smoking. She has never used smokeless tobacco. She reports that she does not drink alcohol or use drugs.  ROS: UROLOGY Frequent Urination?: Yes Hard to postpone urination?: No Burning/pain with urination?: No Get up at night to urinate?: Yes Leakage of urine?: No Urine stream starts and stops?: No Trouble starting stream?: No Do you have to strain to urinate?: No Blood in urine?: No Urinary tract infection?: Yes Sexually transmitted disease?: No Injury to kidneys or bladder?: No Painful intercourse?: No Weak stream?: No Currently pregnant?: No Vaginal bleeding?: No Last menstrual period?: Hysterectomy   Gastrointestinal Nausea?: No Vomiting?: No Indigestion/heartburn?: No Diarrhea?: No Constipation?: Yes  Constitutional Fever: No Night sweats?: No Weight loss?: No Fatigue?: No  Skin Skin rash/lesions?: No Itching?: No  Eyes Blurred vision?: No Double vision?: No  Ears/Nose/Throat Sore throat?: No Sinus problems?: No  Hematologic/Lymphatic Swollen glands?: No Easy bruising?: No  Cardiovascular Chest pain?: No  Respiratory Cough?: No Shortness of breath?: No  Endocrine Excessive thirst?: No  Musculoskeletal Back pain?: No Joint pain?: No  Neurological Headaches?: No  Psychologic Depression?: No Anxiety?: No  Physical  Exam: BP (!) 152/79 (BP Location: Left Arm, Patient Position: Sitting, Cuff Size: Normal)   Pulse 80   Ht 5\' 3"  (1.6 m)   Wt 148 lb 3.2 oz (67.2 kg)   BMI 26.25 kg/m   Constitutional:  Alert and oriented, No acute distress. HEENT: [place] AT, moist mucus membranes.  Trachea midline, no masses. Cardiovascular: No clubbing, cyanosis, or edema. Respiratory: Normal respiratory effort, no increased work of breathing. GI: Abdomen is soft, nontender, nondistended, no abdominal masses GU: No CVA tenderness Lymph: No cervical or inguinal lymphadenopathy. Skin: No rashes, bruises or suspicious lesions. Neurologic: Grossly intact, no focal deficits, moving all 4 extremities. Psychiatric: Normal mood and affect.  Laboratory Data:  Urinalysis Dipstick trace blood/microscopy negative  Pertinent Imaging: Images were personally reviewed Results for orders placed during the hospital encounter of 03/17/18  DG Abdomen 1 View   Narrative CLINICAL DATA:  77 year old female with left lower quadrant abdominal pain. Proximal left ureteral calculus diagnosed on CT 2 days ago.  EXAM: ABDOMEN - 1 VIEW  COMPARISON:  CT Abdomen and Pelvis 03/15/2018 and earlier.  FINDINGS: Two supine views of the abdomen. Bulky left upper quadrant dystrophic calcification is stable. Lung bases appear negative. Non obstructed bowel gas pattern.  The 3-4 millimeter calculus which was visible on the CT scout view at the L3-L4 level has travel distally and now projects in the left hemipelvis (arrow) at or near the left ureterovesical junction. The bladder appears decompressed. Other abdominal and pelvic visceral contours are normal.  No acute osseous abnormality identified.  IMPRESSION: 1. Left ureteral calculus now projects in the pelvis at or near the left UVJ. 2. Otherwise stable abdomen.   Electronically Signed   By: Genevie Ann M.D.   On: 03/17/2018 07:01     Results for orders placed during the hospital  encounter of 03/15/18  CT Renal Stone Study   Narrative CLINICAL DATA:  Left flank pain, left lower abdominal pain  EXAM: CT ABDOMEN AND PELVIS WITHOUT CONTRAST  TECHNIQUE: Multidetector CT imaging of the abdomen and pelvis was performed following the standard protocol without IV contrast.  COMPARISON:  03/14/2013  FINDINGS: Lower chest: No acute abnormality.  Hepatobiliary: No focal hepatic abnormality. Gallbladder unremarkable.  Pancreas: No focal abnormality or ductal dilatation.  Spleen: No focal abnormality.  Normal size.  Adrenals/Urinary Tract: There is mild left hydronephrosis and perinephric stranding due to a 3 mm mid left ureteral stone just above the pelvic brim. No stones or hydronephrosis on the right. Adrenal glands are unremarkable. Urinary bladder decompressed.  Stomach/Bowel: There is a area of mild wall thickening within the distal descending colon. Slight haziness noted around this segment of: With small adjacent lymph nodes. Appendix is normal. Stomach and small bowel decompressed, unremarkable.  Vascular/Lymphatic: No evidence of aneurysm or adenopathy. Densely calcified structure is again noted in the left upper quadrant adjacent to the splenic artery, measuring up to 3.1 cm, stable since 2014. 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  Reproductive: Prior hysterectomy.  No adnexal masses.  Other: No free fluid  or free air. Haziness noted throughout the central mesentery with mildly prominent mesenteric lymph nodes. This is stable since 2014.  Musculoskeletal: No acute bony abnormality.  IMPRESSION: 3 mm mid left ureteral stone with mild left hydronephrosis and perinephric stranding.  Area of apparent circumferential wall thickening within the distal descending colon. There is slight haziness/inflammation in the adjacent mesentery with adjacent mesenteric lymph nodes. While this could  reflect an area of focal colitis/diverticulitis, I do not see in the diverticula in the area, and I cannot completely exclude early annular colon cancer. Recommend further evaluation with colonoscopy if the patient has not had a recent colonoscopy.  Stable densely calcified structure in the left upper quadrant which could reflect a calcified splenic artery aneurysm, unchanged since 2014.  Stable haziness within the central mesentery since 2014 compatible with a benign process.   Electronically Signed   By: Rolm Baptise M.D.   On: 03/15/2018 10:56     Assessment & Plan:   77 year old female with a recently passed ureteral calculus.  Her constipation is secondary to oxycodone and have recommended MiraLAX.  Her hematochezia is most likely related to constipation however I recommended she contact Dr. Raylene Miyamoto office for further evaluation and gastroenterology referral if recommended.  We discussed stone prevention guidelines and she was provided literature.  She does not desire to pursue a metabolic evaluation at this time.  Her stone was sent for analysis.   Return in about 6 months (around 09/19/2018) for Recheck, KUB.  Abbie Sons, [place] [address], [place], [place] [address] [phone]

## 2018-03-27 ENCOUNTER — Other Ambulatory Visit: Payer: Self-pay | Admitting: Urology

## 2018-10-02 ENCOUNTER — Ambulatory Visit: Payer: Medicare Other | Admitting: Urology

## 2018-10-23 ENCOUNTER — Other Ambulatory Visit (INDEPENDENT_AMBULATORY_CARE_PROVIDER_SITE_OTHER): Payer: Self-pay | Admitting: Vascular Surgery

## 2018-10-23 DIAGNOSIS — I728 Aneurysm of other specified arteries: Secondary | ICD-10-CM

## 2018-10-24 ENCOUNTER — Encounter (INDEPENDENT_AMBULATORY_CARE_PROVIDER_SITE_OTHER): Payer: Self-pay | Admitting: Vascular Surgery

## 2018-10-24 ENCOUNTER — Other Ambulatory Visit: Payer: Self-pay

## 2018-10-24 ENCOUNTER — Ambulatory Visit (INDEPENDENT_AMBULATORY_CARE_PROVIDER_SITE_OTHER): Payer: Medicare Other

## 2018-10-24 ENCOUNTER — Ambulatory Visit (INDEPENDENT_AMBULATORY_CARE_PROVIDER_SITE_OTHER): Payer: Medicare Other | Admitting: Vascular Surgery

## 2018-10-24 VITALS — BP 138/81 | HR 71 | Resp 17 | Ht 63.0 in | Wt 147.0 lb

## 2018-10-24 DIAGNOSIS — I10 Essential (primary) hypertension: Secondary | ICD-10-CM

## 2018-10-24 DIAGNOSIS — I728 Aneurysm of other specified arteries: Secondary | ICD-10-CM | POA: Diagnosis not present

## 2018-10-24 NOTE — Progress Notes (Signed)
MRN : [id number]  Courtney Jefferson is a 78 y.o. (29-Jan-1941) female who presents with chief complaint of  Chief Complaint  Patient presents with  . Follow-up    ultrasound  .  History of Present Illness: Patient returns today in follow up of her splenic artery aneurysm.  She is doing well today without specific complaints.  She denies any abdominal pain.  No food fear or weight loss.  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  Current Outpatient Medications  Medication Sig Dispense Refill  . acetaminophen (TYLENOL) 325 MG tablet Take 2 tablets (650 mg total) by mouth every 6 (six) hours as needed. 60 tablet 0  . BLACK COHOSH PO Take by mouth 2 (two) times daily.    . Calcium Carbonate-Vitamin D 600-400 MG-UNIT tablet Take by mouth.    . tamsulosin (FLOMAX) 0.4 MG CAPS capsule Take 1 capsule (0.4 mg total) by mouth daily after breakfast. (Patient not taking: Reported on 10/24/2018) 14 capsule 0  . triamterene-hydrochlorothiazide (MAXZIDE-25) 37.5-25 MG tablet TAKE 1 TABLET BY MOUTH ONCE A DAY     No current facility-administered medications for this visit.     Past Medical History:  Diagnosis Date  . Hypertension     Past Surgical History:  Procedure Laterality Date  . ABDOMINAL HYSTERECTOMY     partial   Social History  Substance Use Topics  . Smoking status: Former Research scientist (life sciences)  . Smokeless tobacco: Never Used  . Alcohol use No    Family History No bleeding or clotting disorders  No Known Allergies   REVIEW OF SYSTEMS (Negative unless checked)  Constitutional: [] ?Weight loss  [] ?Fever  [] ?Chills Cardiac: [] ?Chest pain   [] ?Chest pressure   [] ?Palpitations   [] ?Shortness of breath when laying flat   [] ?Shortness of breath at rest   [] ?Shortness of breath with exertion. Vascular:  [] ?Pain in legs with walking   [] ?Pain in legs at rest   [] ?Pain in legs when laying flat   [] ?Claudication   [] ?Pain in feet when walking  [] ?Pain in  feet at rest  [] ?Pain in feet when laying flat   [] ?History of DVT   [] ?Phlebitis   [] ?Swelling in legs   [] ?Varicose veins   [] ?Non-healing ulcers Pulmonary:   [] ?Uses home oxygen   [] ?Productive cough   [] ?Hemoptysis   [] ?Wheeze  [] ?COPD   [] ?Asthma Neurologic:  [] ?Dizziness  [] ?Blackouts   [] ?Seizures   [] ?History of stroke   [] ?History of TIA  [] ?Aphasia   [] ?Temporary blindness   [] ?Dysphagia   [] ?Weakness or numbness in arms   [] ?Weakness or numbness in legs Musculoskeletal:  [x] ?Arthritis   [] ?Joint swelling   [] ?Joint pain   [] ?Low back pain Hematologic:  [] ?Easy bruising  [] ?Easy bleeding   [] ?Hypercoagulable state   [] ?Anemic   Gastrointestinal:  [] ?Blood in stool   [] ?Vomiting blood  [] ?Gastroesophageal reflux/heartburn   [] ?Abdominal pain Genitourinary:  [] ?Chronic kidney disease   [] ?Difficult urination  [] ?Frequent urination  [] ?Burning with urination   [] ?Hematuria Skin:  [] ?Rashes   [] ?Ulcers   [] ?Wounds Psychological:  [] ?History of anxiety   [] ? History of major depression.    Physical Examination  BP 138/81   Pulse 71   Resp 17   Ht 5\' 3"  (1.6 m)   Wt 147 lb (66.7 kg)   BMI 26.04 kg/m  Gen:  WD/WN, NAD Head: [place]/AT, No temporalis wasting. Ear/Nose/Throat: Hearing grossly intact, nares w/o erythema or drainage Eyes: Conjunctiva clear. Sclera non-icteric Neck:  Supple.  Trachea midline Pulmonary:  Good air movement, no use of accessory muscles.  Cardiac: RRR, no JVD Vascular:  Vessel Right Left  Radial Palpable Palpable                                   Gastrointestinal: soft, non-tender/non-distended. No guarding/reflex.  Musculoskeletal: M/S 5/5 throughout.  No deformity or atrophy. Neurologic: Sensation grossly intact in extremities.  Symmetrical.  Speech is fluent.  Psychiatric: Judgment intact, Mood & affect appropriate for pt's clinical situation. Dermatologic: No rashes or ulcers noted.  No cellulitis or open wounds.       Labs No results  found for this or any previous visit (from the past 2160 hour(s)).  Radiology No results found.  Assessment/Plan Hypertension blood pressure control important in reducing the progression of atherosclerotic disease and aneurysmal degeneration. On appropriate oral medications.  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 This has been stable and she is doing well. She is beyond childbearing years. No intervention is required for the small splenic artery aneurysm at this time. I'm going to lengthen her follow up to 24 months with duplex.   Leotis Pain, MD  10/24/2018 8:53 AM    This note was created with Dragon medical transcription system.  Any errors from dictation are purely unintentional

## 2019-05-23 ENCOUNTER — Other Ambulatory Visit: Payer: Self-pay | Admitting: Family Medicine

## 2019-05-23 DIAGNOSIS — Z1231 Encounter for screening mammogram for malignant neoplasm of breast: Secondary | ICD-10-CM

## 2019-07-11 ENCOUNTER — Ambulatory Visit
Admission: RE | Admit: 2019-07-11 | Discharge: 2019-07-11 | Disposition: A | Discharge status: Home / Self Care | Payer: Medicare Other | Source: Ambulatory Visit | Attending: Family Medicine | Admitting: Family Medicine

## 2019-07-11 DIAGNOSIS — Z1231 Encounter for screening mammogram for malignant neoplasm of breast: Secondary | ICD-10-CM | POA: Diagnosis present

## 2019-08-17 ENCOUNTER — Other Ambulatory Visit (HOSPITAL_COMMUNITY): Payer: Self-pay | Admitting: Physician Assistant

## 2019-08-17 ENCOUNTER — Other Ambulatory Visit: Payer: Self-pay | Admitting: Physician Assistant

## 2019-08-17 ENCOUNTER — Other Ambulatory Visit: Payer: Self-pay

## 2019-08-17 ENCOUNTER — Ambulatory Visit
Admission: RE | Admit: 2019-08-17 | Discharge: 2019-08-17 | Disposition: A | Discharge status: Home / Self Care | Payer: Medicare Other | Source: Ambulatory Visit | Attending: Physician Assistant | Admitting: Physician Assistant

## 2019-08-17 ENCOUNTER — Inpatient Hospital Stay
Admission: EM | Admit: 2019-08-17 | Discharge: 2019-08-22 | DRG: 330 | Disposition: A | Discharge status: Home Health | Payer: Medicare Other | Attending: General Surgery | Admitting: General Surgery

## 2019-08-17 DIAGNOSIS — Z20828 Contact with and (suspected) exposure to other viral communicable diseases: Secondary | ICD-10-CM | POA: Diagnosis present

## 2019-08-17 DIAGNOSIS — Z87891 Personal history of nicotine dependence: Secondary | ICD-10-CM

## 2019-08-17 DIAGNOSIS — Z90710 Acquired absence of both cervix and uterus: Secondary | ICD-10-CM | POA: Diagnosis not present

## 2019-08-17 DIAGNOSIS — I10 Essential (primary) hypertension: Secondary | ICD-10-CM | POA: Diagnosis present

## 2019-08-17 DIAGNOSIS — E876 Hypokalemia: Secondary | ICD-10-CM | POA: Diagnosis present

## 2019-08-17 DIAGNOSIS — C187 Malignant neoplasm of sigmoid colon: Secondary | ICD-10-CM | POA: Diagnosis present

## 2019-08-17 DIAGNOSIS — K6389 Other specified diseases of intestine: Secondary | ICD-10-CM

## 2019-08-17 DIAGNOSIS — R1032 Left lower quadrant pain: Secondary | ICD-10-CM

## 2019-08-17 DIAGNOSIS — K5900 Constipation, unspecified: Secondary | ICD-10-CM | POA: Diagnosis present

## 2019-08-17 DIAGNOSIS — R1084 Generalized abdominal pain: Secondary | ICD-10-CM

## 2019-08-17 DIAGNOSIS — Z85028 Personal history of other malignant neoplasm of stomach: Secondary | ICD-10-CM

## 2019-08-17 DIAGNOSIS — C787 Secondary malignant neoplasm of liver and intrahepatic bile duct: Secondary | ICD-10-CM | POA: Diagnosis present

## 2019-08-17 DIAGNOSIS — R140 Abdominal distension (gaseous): Secondary | ICD-10-CM | POA: Diagnosis present

## 2019-08-17 DIAGNOSIS — K56609 Unspecified intestinal obstruction, unspecified as to partial versus complete obstruction: Secondary | ICD-10-CM | POA: Diagnosis present

## 2019-08-17 LAB — CBC WITH DIFFERENTIAL/PLATELET
Abs Immature Granulocytes: 0.08 10*3/uL — ABNORMAL HIGH (ref 0.00–0.07)
Basophils Absolute: 0.1 10*3/uL (ref 0.0–0.1)
Basophils Relative: 0 %
Eosinophils Absolute: 0 10*3/uL (ref 0.0–0.5)
Eosinophils Relative: 0 %
HCT: 43.6 % (ref 36.0–46.0)
Hemoglobin: 14.1 g/dL (ref 12.0–15.0)
Immature Granulocytes: 1 %
Lymphocytes Relative: 16 %
Lymphs Abs: 2.7 10*3/uL (ref 0.7–4.0)
MCH: 29 pg (ref 26.0–34.0)
MCHC: 32.3 g/dL (ref 30.0–36.0)
MCV: 89.5 fL (ref 80.0–100.0)
Monocytes Absolute: 0.8 10*3/uL (ref 0.1–1.0)
Monocytes Relative: 5 %
Neutro Abs: 12.7 10*3/uL — ABNORMAL HIGH (ref 1.7–7.7)
Neutrophils Relative %: 78 %
Platelets: 331 10*3/uL (ref 150–400)
RBC: 4.87 MIL/uL (ref 3.87–5.11)
RDW: 13.2 % (ref 11.5–15.5)
WBC: 16.4 10*3/uL — ABNORMAL HIGH (ref 4.0–10.5)
nRBC: 0 % (ref 0.0–0.2)

## 2019-08-17 LAB — COMPREHENSIVE METABOLIC PANEL
ALT: 17 U/L (ref 0–44)
AST: 18 U/L (ref 15–41)
Albumin: 4.3 g/dL (ref 3.5–5.0)
Alkaline Phosphatase: 89 U/L (ref 38–126)
Anion gap: 16 — ABNORMAL HIGH (ref 5–15)
BUN: 15 mg/dL (ref 8–23)
CO2: 20 mmol/L — ABNORMAL LOW (ref 22–32)
Calcium: 9.3 mg/dL (ref 8.9–10.3)
Chloride: 98 mmol/L (ref 98–111)
Creatinine, Ser: 0.86 mg/dL (ref 0.44–1.00)
GFR calc Af Amer: >60 mL/min (ref >60)
GFR calc non Af Amer: >60 mL/min (ref >60)
Glucose, Bld: 144 mg/dL — ABNORMAL HIGH (ref 70–99)
Potassium: 2.8 mmol/L — ABNORMAL LOW (ref 3.5–5.1)
Sodium: 134 mmol/L — ABNORMAL LOW (ref 135–145)
Total Bilirubin: 0.9 mg/dL (ref 0.3–1.2)
Total Protein: 8.5 g/dL — ABNORMAL HIGH (ref 6.5–8.1)

## 2019-08-17 LAB — LACTIC ACID, PLASMA
Lactic Acid, Venous: 2 mmol/L (ref 0.5–1.9)
Lactic Acid, Venous: 1.3 mmol/L (ref 0.5–1.9)

## 2019-08-17 LAB — PROTIME-INR
INR: 1.1 (ref 0.8–1.2)
Prothrombin Time: 13.6 seconds (ref 11.4–15.2)

## 2019-08-17 LAB — MAGNESIUM: Magnesium: 2.2 mg/dL (ref 1.7–2.4)

## 2019-08-17 LAB — LIPASE, BLOOD: Lipase: 25 U/L (ref 11–51)

## 2019-08-17 MED ORDER — MORPHINE SULFATE (PF) 4 MG/ML IV SOLN
4.0000 mg | Freq: Once | INTRAVENOUS | Status: AC
Start: 2019-08-17 — End: 2019-08-17
  Administered 2019-08-17: 23:00:00 4 mg via INTRAVENOUS
  Filled 2019-08-17: qty 1

## 2019-08-17 MED ORDER — FENTANYL CITRATE (PF) 100 MCG/2ML IJ SOLN
50.0000 ug | INTRAMUSCULAR | Status: DC | PRN
  Administered 2019-08-17: 23:00:00 50 ug via INTRAVENOUS
  Filled 2019-08-17: qty 2

## 2019-08-17 MED ORDER — LACTATED RINGERS IV BOLUS: 1000.0000 mL | Freq: Once | INTRAVENOUS | Status: DC

## 2019-08-17 MED ORDER — ONDANSETRON HCL 4 MG/2ML IJ SOLN
4.0000 mg | Freq: Once | INTRAMUSCULAR | Status: AC
  Administered 2019-08-17: 4 mg via INTRAVENOUS
  Filled 2019-08-17: qty 2

## 2019-08-17 MED ORDER — MAGNESIUM SULFATE 2 GM/50ML IV SOLN
2.0000 g | Freq: Once | INTRAVENOUS | Status: AC
  Administered 2019-08-18: 2 g via INTRAVENOUS
  Filled 2019-08-17: qty 50

## 2019-08-17 MED ORDER — LACTATED RINGERS IV SOLN
INTRAVENOUS | Status: DC
Start: 2019-08-17 — End: 2019-08-17

## 2019-08-17 MED ORDER — IOHEXOL 300 MG/ML  SOLN
100.0000 mL | Freq: Once | INTRAMUSCULAR | Status: AC | PRN
  Administered 2019-08-17: 100 mL via INTRAVENOUS

## 2019-08-17 MED ORDER — MAGNESIUM CITRATE PO SOLN
1.0000 | Freq: Three times a day (TID) | ORAL | Status: DC
  Administered 2019-08-18: 1 via ORAL
  Filled 2019-08-17: qty 296

## 2019-08-17 MED ORDER — KETOROLAC TROMETHAMINE 30 MG/ML IJ SOLN: 15.0000 mg | Freq: Four times a day (QID) | INTRAMUSCULAR | Status: DC | PRN

## 2019-08-17 MED ORDER — POTASSIUM CHLORIDE 10 MEQ/100ML IV SOLN
10.0000 meq | INTRAVENOUS | Status: DC
  Administered 2019-08-18 (×2): 10 meq via INTRAVENOUS
  Filled 2019-08-17 (×6): qty 100

## 2019-08-17 MED ORDER — LORAZEPAM 2 MG/ML IJ SOLN
0.5000 mg | Freq: Once | INTRAMUSCULAR | Status: AC
  Administered 2019-08-18: 0.5 mg via INTRAVENOUS
  Filled 2019-08-17: qty 1

## 2019-08-17 MED ORDER — ENOXAPARIN SODIUM 40 MG/0.4ML ~~LOC~~ SOLN
40.0000 mg | SUBCUTANEOUS | Status: DC
  Administered 2019-08-18: 40 mg via SUBCUTANEOUS
  Filled 2019-08-17 (×2): qty 0.4

## 2019-08-17 MED ORDER — ACETAMINOPHEN 325 MG PO TABS: 650.0000 mg | ORAL_TABLET | Freq: Four times a day (QID) | ORAL | Status: DC | PRN

## 2019-08-17 MED ORDER — ACETAMINOPHEN 650 MG RE SUPP: 650.0000 mg | Freq: Four times a day (QID) | RECTAL | Status: DC | PRN

## 2019-08-17 MED ORDER — PIPERACILLIN-TAZOBACTAM 3.375 G IVPB
3.3750 g | Freq: Three times a day (TID) | INTRAVENOUS | Status: DC
  Filled 2019-08-17: qty 50

## 2019-08-17 MED ORDER — HYDROMORPHONE HCL 1 MG/ML IJ SOLN
0.5000 mg | INTRAMUSCULAR | Status: DC | PRN
  Administered 2019-08-18 – 2019-08-19 (×7): 0.5 mg via INTRAVENOUS
  Filled 2019-08-17 (×6): qty 0.5
  Filled 2019-08-17: qty 1

## 2019-08-17 MED ORDER — PIPERACILLIN-TAZOBACTAM 3.375 G IVPB 30 MIN
3.3750 g | INTRAVENOUS | Status: AC
  Administered 2019-08-18: 3.375 g via INTRAVENOUS
  Filled 2019-08-17: qty 50

## 2019-08-17 MED ORDER — SODIUM CHLORIDE 0.9 % IV BOLUS
1000.0000 mL | Freq: Once | INTRAVENOUS | Status: AC
  Administered 2019-08-17: 1000 mL via INTRAVENOUS

## 2019-08-17 MED ORDER — NEOMYCIN SULFATE 500 MG PO TABS
1000.0000 mg | ORAL_TABLET | Freq: Once | ORAL | Status: DC
  Filled 2019-08-17: qty 2

## 2019-08-17 MED ORDER — METOPROLOL TARTRATE 5 MG/5ML IV SOLN: 5.0000 mg | INTRAVENOUS | Status: DC | PRN

## 2019-08-17 MED ORDER — ONDANSETRON 4 MG PO TBDP: 4.0000 mg | ORAL_TABLET | Freq: Four times a day (QID) | ORAL | Status: DC | PRN

## 2019-08-17 MED ORDER — ERYTHROMYCIN BASE 250 MG PO TBEC
500.0000 mg | DELAYED_RELEASE_TABLET | Freq: Once | ORAL | Status: DC
  Filled 2019-08-17: qty 2

## 2019-08-17 MED ORDER — ONDANSETRON HCL 4 MG/2ML IJ SOLN
4.0000 mg | Freq: Four times a day (QID) | INTRAMUSCULAR | Status: DC | PRN
  Administered 2019-08-18: 4 mg via INTRAVENOUS

## 2019-08-17 MED ORDER — PANTOPRAZOLE SODIUM 40 MG IV SOLR
40.0000 mg | Freq: Every day | INTRAVENOUS | Status: DC
  Administered 2019-08-18 (×2): 40 mg via INTRAVENOUS
  Filled 2019-08-17 (×2): qty 40

## 2019-08-17 MED ORDER — LACTATED RINGERS IV SOLN
INTRAVENOUS | Status: DC
  Administered 2019-08-18 (×3): via INTRAVENOUS

## 2019-08-17 NOTE — ED Provider Notes (Signed)
[facility] Emergency Department Provider Note  ____________________________________________  Time seen: Approximately 8:01 PM  The following is a medical screening exam note. It is intended that the patient await an ER room assignment for detailed exam, diagnosis, and disposition.  I have reviewed the triage vital signs.    HISTORY  Chief Complaint Abdominal Pain   HPI Courtney Jefferson is a 78 y.o. female presents to the emergency department for treatment and evaluation of abdominal pain.  Symptoms have been present for the past several days.  She also has some nausea but denies vomiting or diarrhea.  She has CT scan earlier today that shows a potential "bowel obstruction."  She states that she has not had a bowel movement for over a week.  She denies fever.  No alleviating measures attempted prior to arrival.   Past Medical History:  Diagnosis Date  . Hypertension     Patient Active Problem List   Diagnosis Date Noted  . Hypertension 05/03/2017  . Splenic artery aneurysm ([place]) 05/03/2017    Past Surgical History:  Procedure Laterality Date  . ABDOMINAL HYSTERECTOMY     partial    Prior to Admission medications   Medication Sig Start Date End Date Taking? Authorizing Provider  acetaminophen (TYLENOL) 325 MG tablet Take 2 tablets (650 mg total) by mouth every 6 (six) hours as needed. 03/15/18   Carrie Mew, MD  BLACK COHOSH PO Take by mouth 2 (two) times daily.    [provider]  Calcium Carbonate-Vitamin D 600-400 MG-UNIT tablet Take by mouth.    [provider]  tamsulosin (FLOMAX) 0.4 MG CAPS capsule Take 1 capsule (0.4 mg total) by mouth daily after breakfast. Patient not taking: Reported on 10/24/2018 03/17/18   Gregor Hams, MD  triamterene-hydrochlorothiazide (MAXZIDE-25) 37.5-25 MG tablet TAKE 1 TABLET BY MOUTH ONCE A DAY 12/06/16   [provider]    Allergies Patient has no known allergies.  Family  History  Problem Relation Age of Onset  . Diabetes Sister     Social History Social History   Tobacco Use  . Smoking status: Former Research scientist (life sciences)  . Smokeless tobacco: Never Used  Substance Use Topics  . Alcohol use: No  . Drug use: No    Review of Systems Constitutional: Negative for fever. Respiratory: Negative for shortness of breath Gastrointestinal: Positive for abdominal pain with nausea.  Positive for constipation. Musculoskeletal: Negative for generalized body aches. Neurological: Negative for headaches, focal weakness or numbness.  ____________________________________________   PHYSICAL EXAM:  VITAL SIGNS:  Today's Vitals   08/17/19 1826 08/17/19 1851  BP: (!) 154/109   Pulse: (!) 120   Resp: 16   Temp: 98.1 F (36.7 C)   TempSrc: Oral   SpO2: 98%   Weight:  65.8 kg  Height:  5\' 3"  (1.6 m)  PainSc:  10-Worst pain ever   Body mass index is 25.69 kg/m.   Constitutional: Alert and oriented.  Tearful Head: Atraumatic. Mouth/Throat: Mucous membranes are moist. Neck: No stridor.  Cardiovascular: Good peripheral circulation. Gastrointestinal: Abdomen is somewhat distended and diffusely tender.  Respiratory: Normal respiratory effort. Musculoskeletal: No restriction Neurologic:  Normal speech and language. No gross focal neurologic deficits are appreciated. Speech is normal. No gait instability. Skin:  Skin is warm, dry and intact. No rash noted. Psychiatric: Anxious and tearful.  ____________________________________________   LABS (all labs ordered are listed, but only abnormal results are displayed)  Labs Reviewed  COMPREHENSIVE METABOLIC PANEL - Abnormal; Notable for  the following components:      Result Value   Sodium 134 (*)    Potassium 2.8 (*)    CO2 20 (*)    Glucose, Bld 144 (*)    Total Protein 8.5 (*)    Anion gap 16 (*)    All other components within normal limits  LACTIC ACID, PLASMA - Abnormal; Notable for the following components:    Lactic Acid, Venous 2.0 (*)    All other components within normal limits  CBC WITH DIFFERENTIAL/PLATELET - Abnormal; Notable for the following components:   WBC 16.4 (*)    Neutro Abs 12.7 (*)    Abs Immature Granulocytes 0.08 (*)    All other components within normal limits  LIPASE, BLOOD  PROTIME-INR  LACTIC ACID, PLASMA   ____________________________________________  EKG   ____________________________________________   INITIAL CLINICAL IMPRESSION(S)   Bowel obstruction   Victorino Dike, FNP 08/17/19 2006    Duffy Bruce, MD 08/18/19 1558

## 2019-08-17 NOTE — Plan of Care (Signed)
78ny/o F presenting to the ED with near-obstructing colon mass.  Apparently present over a year ago, but no further evaluation or intervention occurred.  Will admit, resuscitate, and likely perform colectomy within the next day or so, pending adequate resuscitation and electrolyte normalization.  Full H&P to follow.

## 2019-08-17 NOTE — ED Triage Notes (Addendum)
Pt comes via POV from [facility] with c/o abdominal pain. Pt states this has been going on for few days.  Pt states some nausea. Pt denies vomiting and diarrhea.  Pt has paperwork that shows results from CT scan. Pt vomiting and very tearful in triage.

## 2019-08-18 ENCOUNTER — Inpatient Hospital Stay: Payer: Medicare Other

## 2019-08-18 ENCOUNTER — Inpatient Hospital Stay: Payer: Medicare Other | Admitting: Anesthesiology

## 2019-08-18 ENCOUNTER — Encounter: Payer: Self-pay | Admitting: Anesthesiology

## 2019-08-18 ENCOUNTER — Encounter
Admission: EM | Disposition: A | Discharge status: Home / Self Care | Payer: Self-pay | Source: Home / Self Care | Attending: General Surgery

## 2019-08-18 HISTORY — PX: LAPAROTOMY: SHX154

## 2019-08-18 HISTORY — PX: COLECTOMY WITH COLOSTOMY CREATION/HARTMANN PROCEDURE: SHX6598

## 2019-08-18 LAB — COMPREHENSIVE METABOLIC PANEL
ALT: 13 U/L (ref 0–44)
AST: 14 U/L — ABNORMAL LOW (ref 15–41)
Albumin: 3.5 g/dL (ref 3.5–5.0)
Alkaline Phosphatase: 72 U/L (ref 38–126)
Anion gap: 9 (ref 5–15)
BUN: 14 mg/dL (ref 8–23)
CO2: 22 mmol/L (ref 22–32)
Calcium: 8.3 mg/dL — ABNORMAL LOW (ref 8.9–10.3)
Chloride: 105 mmol/L (ref 98–111)
Creatinine, Ser: 0.68 mg/dL (ref 0.44–1.00)
GFR calc Af Amer: >60 mL/min (ref >60)
GFR calc non Af Amer: >60 mL/min (ref >60)
Glucose, Bld: 126 mg/dL — ABNORMAL HIGH (ref 70–99)
Potassium: 2.7 mmol/L — CL (ref 3.5–5.1)
Sodium: 136 mmol/L (ref 135–145)
Total Bilirubin: 0.8 mg/dL (ref 0.3–1.2)
Total Protein: 7.2 g/dL (ref 6.5–8.1)

## 2019-08-18 LAB — CBC
HCT: 38.9 % (ref 36.0–46.0)
Hemoglobin: 13 g/dL (ref 12.0–15.0)
MCH: 29.5 pg (ref 26.0–34.0)
MCHC: 33.4 g/dL (ref 30.0–36.0)
MCV: 88.4 fL (ref 80.0–100.0)
Platelets: 268 10*3/uL (ref 150–400)
RBC: 4.4 MIL/uL (ref 3.87–5.11)
RDW: 13.3 % (ref 11.5–15.5)
WBC: 12.2 10*3/uL — ABNORMAL HIGH (ref 4.0–10.5)
nRBC: 0 % (ref 0.0–0.2)

## 2019-08-18 LAB — MAGNESIUM: Magnesium: 2.5 mg/dL — ABNORMAL HIGH (ref 1.7–2.4)

## 2019-08-18 LAB — SARS CORONAVIRUS 2 (TAT 6-24 HRS): SARS Coronavirus 2: NEGATIVE

## 2019-08-18 LAB — PHOSPHORUS: Phosphorus: 3.2 mg/dL (ref 2.5–4.6)

## 2019-08-18 SURGERY — LAPAROTOMY, EXPLORATORY
Anesthesia: General

## 2019-08-18 MED ORDER — OXYCODONE HCL 5 MG PO TABS: 5.0000 mg | ORAL_TABLET | Freq: Once | ORAL | Status: DC | PRN

## 2019-08-18 MED ORDER — SUCCINYLCHOLINE CHLORIDE 20 MG/ML IJ SOLN
INTRAMUSCULAR | Status: DC | PRN
  Administered 2019-08-18: 100 mg via INTRAVENOUS

## 2019-08-18 MED ORDER — GLYCOPYRROLATE 0.2 MG/ML IJ SOLN
INTRAMUSCULAR | Status: AC
  Filled 2019-08-18: qty 1

## 2019-08-18 MED ORDER — PHENOL 1.4 % MT LIQD
1.0000 | OROMUCOSAL | Status: DC | PRN
  Filled 2019-08-18: qty 177

## 2019-08-18 MED ORDER — SUCCINYLCHOLINE CHLORIDE 20 MG/ML IJ SOLN
INTRAMUSCULAR | Status: AC
  Filled 2019-08-18: qty 1

## 2019-08-18 MED ORDER — MIDAZOLAM HCL 2 MG/2ML IJ SOLN
INTRAMUSCULAR | Status: DC | PRN
  Administered 2019-08-18 (×2): 1 mg via INTRAVENOUS

## 2019-08-18 MED ORDER — LIDOCAINE HCL (CARDIAC) PF 100 MG/5ML IV SOSY
PREFILLED_SYRINGE | INTRAVENOUS | Status: DC | PRN
  Administered 2019-08-18: 80 mg via INTRAVENOUS

## 2019-08-18 MED ORDER — BUPIVACAINE LIPOSOME 1.3 % IJ SUSP
INTRAMUSCULAR | Status: DC | PRN
  Administered 2019-08-18: 20 mL

## 2019-08-18 MED ORDER — BUPIVACAINE HCL (PF) 0.25 % IJ SOLN
INTRAMUSCULAR | Status: DC | PRN
  Administered 2019-08-18: 30 mL

## 2019-08-18 MED ORDER — OXYCODONE HCL 5 MG/5ML PO SOLN: 5.0000 mg | Freq: Once | ORAL | Status: DC | PRN

## 2019-08-18 MED ORDER — PROPOFOL 10 MG/ML IV BOLUS
INTRAVENOUS | Status: DC | PRN
  Administered 2019-08-18 (×3): 20 mg via INTRAVENOUS
  Administered 2019-08-18: 120 mg via INTRAVENOUS
  Administered 2019-08-18: 20 mg via INTRAVENOUS
  Administered 2019-08-18: 30 mg via INTRAVENOUS

## 2019-08-18 MED ORDER — SUGAMMADEX SODIUM 200 MG/2ML IV SOLN
INTRAVENOUS | Status: AC
  Filled 2019-08-18: qty 2

## 2019-08-18 MED ORDER — BUPIVACAINE HCL (PF) 0.25 % IJ SOLN
INTRAMUSCULAR | Status: AC
  Filled 2019-08-18: qty 30

## 2019-08-18 MED ORDER — FENTANYL CITRATE (PF) 100 MCG/2ML IJ SOLN
INTRAMUSCULAR | Status: DC | PRN
  Administered 2019-08-18 (×2): 50 ug via INTRAVENOUS
  Administered 2019-08-18 (×4): 25 ug via INTRAVENOUS

## 2019-08-18 MED ORDER — BUPIVACAINE LIPOSOME 1.3 % IJ SUSP
INTRAMUSCULAR | Status: AC
  Filled 2019-08-18: qty 20

## 2019-08-18 MED ORDER — PROPOFOL 10 MG/ML IV BOLUS
INTRAVENOUS | Status: AC
  Filled 2019-08-18: qty 60

## 2019-08-18 MED ORDER — ROCURONIUM BROMIDE 50 MG/5ML IV SOLN
INTRAVENOUS | Status: AC
  Filled 2019-08-18: qty 1

## 2019-08-18 MED ORDER — SODIUM CHLORIDE 0.9 % IV SOLN
1.0000 g | Freq: Once | INTRAVENOUS | Status: AC
  Administered 2019-08-18: 1 g via INTRAVENOUS
  Filled 2019-08-18: qty 1

## 2019-08-18 MED ORDER — CHLORHEXIDINE GLUCONATE CLOTH 2 % EX PADS: 6.0000 | MEDICATED_PAD | Freq: Every day | CUTANEOUS | Status: DC

## 2019-08-18 MED ORDER — MIDAZOLAM HCL 2 MG/2ML IJ SOLN
INTRAMUSCULAR | Status: AC
  Filled 2019-08-18: qty 2

## 2019-08-18 MED ORDER — FENTANYL CITRATE (PF) 100 MCG/2ML IJ SOLN: 25.0000 ug | INTRAMUSCULAR | Status: DC | PRN

## 2019-08-18 MED ORDER — FENTANYL CITRATE (PF) 100 MCG/2ML IJ SOLN
INTRAMUSCULAR | Status: AC
  Filled 2019-08-18: qty 2

## 2019-08-18 MED ORDER — ROCURONIUM BROMIDE 100 MG/10ML IV SOLN
INTRAVENOUS | Status: DC | PRN
  Administered 2019-08-18: 40 mg via INTRAVENOUS
  Administered 2019-08-18: 10 mg via INTRAVENOUS

## 2019-08-18 MED ORDER — DEXMEDETOMIDINE HCL 200 MCG/2ML IV SOLN
INTRAVENOUS | Status: DC | PRN
  Administered 2019-08-18 (×2): 4 ug via INTRAVENOUS

## 2019-08-18 MED ORDER — POTASSIUM CHLORIDE 10 MEQ/100ML IV SOLN
10.0000 meq | INTRAVENOUS | Status: DC
  Administered 2019-08-18 (×3): 10 meq via INTRAVENOUS
  Filled 2019-08-18 (×3): qty 100

## 2019-08-18 MED ORDER — ACETAMINOPHEN 10 MG/ML IV SOLN
INTRAVENOUS | Status: DC | PRN
  Administered 2019-08-18: 1000 mg via INTRAVENOUS

## 2019-08-18 MED ORDER — LACTATED RINGERS IV SOLN
INTRAVENOUS | Status: DC | PRN
  Administered 2019-08-18: 14:00:00 via INTRAVENOUS

## 2019-08-18 MED ORDER — ONDANSETRON HCL 4 MG/2ML IJ SOLN
INTRAMUSCULAR | Status: AC
  Filled 2019-08-18: qty 2

## 2019-08-18 MED ORDER — PHENYLEPHRINE HCL (PRESSORS) 10 MG/ML IV SOLN
INTRAVENOUS | Status: DC | PRN
  Administered 2019-08-18: 80 ug via INTRAVENOUS

## 2019-08-18 MED ORDER — SUGAMMADEX SODIUM 200 MG/2ML IV SOLN
INTRAVENOUS | Status: DC | PRN
  Administered 2019-08-18: 200 mg via INTRAVENOUS

## 2019-08-18 SURGICAL SUPPLY — 59 items
APPLIER CLIP ROT 10 11.4 M/L (STAPLE)
BAG COUNTER SPONGE EZ (MISCELLANEOUS) ×3 IMPLANT
BLADE SURG 10 STRL SS SAFETY (BLADE) ×4 IMPLANT
BLADE SURG 15 STRL SS SAFETY (BLADE) ×4 IMPLANT
BULB RESERV EVAC DRAIN JP 100C (MISCELLANEOUS) IMPLANT
CANISTER SUCT 1200ML W/VALVE (MISCELLANEOUS) ×4 IMPLANT
CHLORAPREP W/TINT 26 (MISCELLANEOUS) ×4 IMPLANT
CLIP APPLIE ROT 10 11.4 M/L (STAPLE) IMPLANT
CLOSURE WOUND 1/2 X4 (GAUZE/BANDAGES/DRESSINGS)
COUNTER SPONGE BAG EZ (MISCELLANEOUS) ×1
COVER CLAMP SIL LG PBX B (MISCELLANEOUS) IMPLANT
COVER WAND RF STERILE (DRAPES) IMPLANT
DRAIN CHANNEL JP 15F RND 16 (MISCELLANEOUS) IMPLANT
DRAPE LAPAROTOMY 100X77 ABD (DRAPES) ×4 IMPLANT
DRSG OPSITE POSTOP 4X8 (GAUZE/BANDAGES/DRESSINGS) ×4 IMPLANT
DRSG TEGADERM 2-3/8X2-3/4 SM (GAUZE/BANDAGES/DRESSINGS) IMPLANT
DRSG TEGADERM 4X4.75 (GAUZE/BANDAGES/DRESSINGS) ×8 IMPLANT
DRSG TELFA 3X8 NADH (GAUZE/BANDAGES/DRESSINGS) ×4 IMPLANT
ELECT BLADE 6 FLAT ULTRCLN (ELECTRODE) ×4 IMPLANT
ELECT REM PT RETURN 9FT ADLT (ELECTROSURGICAL) ×4
ELECTRODE REM PT RTRN 9FT ADLT (ELECTROSURGICAL) ×2 IMPLANT
GAUZE SPONGE 4X4 12PLY STRL (GAUZE/BANDAGES/DRESSINGS) ×4 IMPLANT
GLOVE BIO SURGEON STRL SZ7 (GLOVE) ×12 IMPLANT
GLOVE BIO SURGEON STRL SZ7.5 (GLOVE) ×12 IMPLANT
GLOVE INDICATOR 8.0 STRL GRN (GLOVE) ×12 IMPLANT
GOWN STRL REUS W/ TWL LRG LVL3 (GOWN DISPOSABLE) ×8 IMPLANT
GOWN STRL REUS W/TWL LRG LVL3 (GOWN DISPOSABLE) ×8
HOLDER FOLEY CATH W/STRAP (MISCELLANEOUS) ×4 IMPLANT
KIT OSTOMY DRAINABLE 2.75 STR (WOUND CARE) ×4 IMPLANT
KIT TURNOVER KIT A (KITS) ×4 IMPLANT
LABEL OR SOLS (LABEL) IMPLANT
LIGASURE IMPACT 36 18CM CVD LR (INSTRUMENTS) ×4 IMPLANT
NS IRRIG 1000ML POUR BTL (IV SOLUTION) ×4 IMPLANT
NS IRRIG 500ML POUR BTL (IV SOLUTION) ×4 IMPLANT
PACK BASIN MAJOR ARMC (MISCELLANEOUS) IMPLANT
PENCIL ELECTRO HAND CTR (MISCELLANEOUS) ×4 IMPLANT
RELOAD PROXIMATE 75MM BLUE (ENDOMECHANICALS) ×4 IMPLANT
RETRACTOR WND ALEXIS-O 25 LRG (MISCELLANEOUS) ×2 IMPLANT
RTRCTR WOUND ALEXIS O 25CM LRG (MISCELLANEOUS) ×4
SET YANKAUER POOLE SUCT (MISCELLANEOUS) IMPLANT
SOL PREP PVP 2OZ (MISCELLANEOUS) ×4
SOLUTION PREP PVP 2OZ (MISCELLANEOUS) ×2 IMPLANT
SPONGE KITTNER 5P (MISCELLANEOUS) ×4 IMPLANT
SPONGE LAP 18X18 RF (DISPOSABLE) IMPLANT
STAPLE ECHEON FLEX 60 POW ENDO (STAPLE) IMPLANT
STAPLER PROXIMATE 75MM BLUE (STAPLE) ×4 IMPLANT
STAPLER SKIN PROX 35W (STAPLE) ×4 IMPLANT
STRIP CLOSURE SKIN 1/2X4 (GAUZE/BANDAGES/DRESSINGS) IMPLANT
SUT ETH BLK MONO 3 0 FS 1 12/B (SUTURE) IMPLANT
SUT PROLENE 0 CT 1 30 (SUTURE) IMPLANT
SUT SILK 3-0 (SUTURE) ×4 IMPLANT
SUT VIC AB 2-0 BRD 54 (SUTURE) IMPLANT
SUT VIC AB 3-0 SH 27 (SUTURE) ×8
SUT VIC AB 3-0 SH 27X BRD (SUTURE) ×8 IMPLANT
SUT VIC AB 4-0 FS2 27 (SUTURE) IMPLANT
SUT VICRYL 3-0 SH-1 18IN (SUTURE) ×4 IMPLANT
SUT VICRYL+ 3-0 144IN (SUTURE) IMPLANT
SWABSTK COMLB BENZOIN TINCTURE (MISCELLANEOUS) ×4 IMPLANT
TRAY FOLEY MTR SLVR 16FR STAT (SET/KITS/TRAYS/PACK) ×4 IMPLANT

## 2019-08-18 NOTE — Addendum Note (Signed)
Addendum  created 08/18/19 1735 by Piscitello, Precious Haws, MD   Clinical Note Signed

## 2019-08-18 NOTE — ED Provider Notes (Signed)
[facility] Emergency Department Provider Note  ____________________________________________   First MD Initiated Contact with Patient 08/17/19 2315     (approximate)  I have reviewed the triage vital signs and the nursing notes.   HISTORY  Chief Complaint Abdominal Pain    HPI Courtney Jefferson is a 78 y.o. female  With h/o HTN here with abdominal pain. Pt reports that over the past 48 hours, she has had progressively worsening diffuse abdominal pain with distension, nausea, and decreased stool output. She has had intermittent pains off and on since June of last year, when she reportedly had an abnormal CT. However, these pains are acutely worse over the past 48 hours. She describes severe, aching, gnawing pain with associated nausea. She is belching but has not vomited. She has had small, thin stool out today but minimal amount. No known fevers. No urinary sx.        Past Medical History:  Diagnosis Date  . Hypertension     Patient Active Problem List   Diagnosis Date Noted  . Colonic obstruction ([place]) 08/17/2019  . Hypertension 05/03/2017  . 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 ABDOMINAL HYSTERECTOMY     partial    Prior to Admission medications   Medication Sig Start Date End Date Taking? Authorizing Provider  acetaminophen (TYLENOL) 325 MG tablet Take 2 tablets (650 mg total) by mouth every 6 (six) hours as needed. 03/15/18  Yes Carrie Mew, MD  BLACK COHOSH PO Take 1 tablet by mouth 2 (two) times daily.    Yes [provider]  Calcium Carbonate-Vitamin D 600-400 MG-UNIT tablet Take 1 tablet by mouth 2 (two) times daily.    Yes [provider]  triamterene-hydrochlorothiazide (MAXZIDE-25) 37.5-25 MG tablet Take 1 tablet by mouth daily.  12/06/16  Yes [provider]    Allergies Patient has no known allergies.  Family History  Problem Relation  Age of Onset  . Diabetes Sister     Social History Social History   Tobacco Use  . Smoking status: Former Research scientist (life sciences)  . Smokeless tobacco: Never Used  Substance Use Topics  . Alcohol use: No  . Drug use: No    Review of Systems  Review of Systems  Constitutional: Positive for fatigue. Negative for fever.  HENT: Negative for congestion and sore throat.   Eyes: Negative for visual disturbance.  Respiratory: Negative for cough and shortness of breath.   Cardiovascular: Negative for chest pain.  Gastrointestinal: Positive for abdominal distention, abdominal pain, constipation and nausea. Negative for diarrhea and vomiting.  Genitourinary: Negative for flank pain.  Musculoskeletal: Negative for back pain and neck pain.  Skin: Negative for rash and wound.  Neurological: Negative for weakness.  All other systems reviewed and are negative.    ____________________________________________  PHYSICAL EXAM:      VITAL SIGNS: ED Triage Vitals  Enc Vitals Group     BP 08/17/19 1826 (!) 154/109     Pulse Rate 08/17/19 1826 (!) 120     Resp 08/17/19 1826 16     Temp 08/17/19 1826 98.1 F (36.7 C)     Temp Source 08/17/19 1826 Oral     SpO2 08/17/19 1826 98 %     Weight 08/17/19 1851 145 lb (65.8 kg)     Height 08/17/19 1851 5\' 3"  (1.6 m)     Head Circumference --      Peak Flow --  Pain Score 08/17/19 1851 10     Pain Loc --      Pain Edu? --      Excl. in [place]? --      Physical Exam Vitals signs and nursing note reviewed.  Constitutional:      General: She is not in acute distress.    Appearance: She is well-developed.  HENT:     Head: Normocephalic and atraumatic.     Mouth/Throat:     Mouth: Mucous membranes are dry.  Eyes:     Conjunctiva/sclera: Conjunctivae normal.  Neck:     Musculoskeletal: Neck supple.  Cardiovascular:     Rate and Rhythm: Regular rhythm. Tachycardia present.     Heart sounds: Normal heart sounds. No murmur. No friction rub.  Pulmonary:      Effort: Pulmonary effort is normal. No respiratory distress.     Breath sounds: Normal breath sounds. No wheezing or rales.  Abdominal:     General: Bowel sounds are increased. There is distension.     Palpations: Abdomen is soft.     Tenderness: There is abdominal tenderness in the right lower quadrant, periumbilical area, suprapubic area and left lower quadrant. There is guarding. There is no rebound.  Skin:    General: Skin is warm.     Capillary Refill: Capillary refill takes less than 2 seconds.  Neurological:     Mental Status: She is alert and oriented to person, place, and time.     Motor: No abnormal muscle tone.       ____________________________________________   LABS (all labs ordered are listed, but only abnormal results are displayed)  Labs Reviewed  COMPREHENSIVE METABOLIC PANEL - Abnormal; Notable for the following components:      Result Value   Sodium 134 (*)    Potassium 2.8 (*)    CO2 20 (*)    Glucose, Bld 144 (*)    Total Protein 8.5 (*)    Anion gap 16 (*)    All other components within normal limits  LACTIC ACID, PLASMA - Abnormal; Notable for the following components:   Lactic Acid, Venous 2.0 (*)    All other components within normal limits  CBC WITH DIFFERENTIAL/PLATELET - Abnormal; Notable for the following components:   WBC 16.4 (*)    Neutro Abs 12.7 (*)    Abs Immature Granulocytes 0.08 (*)    All other components within normal limits  SARS CORONAVIRUS 2 (TAT 6-24 HRS)  LIPASE, BLOOD  LACTIC ACID, PLASMA  PROTIME-INR  MAGNESIUM  CBC  COMPREHENSIVE METABOLIC PANEL  MAGNESIUM  PHOSPHORUS    ____________________________________________  EKG: None ________________________________________  RADIOLOGY All imaging, including plain films, CT scans, and ultrasounds, independently reviewed by me, and interpretations confirmed via formal radiology reads.  ED MD interpretation:   CT reviewed, concerning for colonic mass with high grade  obstruction. No perforation  Official radiology report(s): Ct Abdomen Pelvis W Contrast  Addendum Date: 08/17/2019   ADDENDUM REPORT: 08/17/2019 17:47 ADDENDUM: These results were called by telephone at the time of interpretation on 08/17/2019 at 5:47 pm to provider Dr. Wynetta Emery, who verbally acknowledged these results. Electronically Signed   By: Constance Holster M.D.   On: 08/17/2019 17:47   Result Date: 08/17/2019 CLINICAL DATA:  Constipation EXAM: CT ABDOMEN AND PELVIS WITH CONTRAST TECHNIQUE: Multidetector CT imaging of the abdomen and pelvis was performed using the standard protocol following bolus administration of intravenous contrast. CONTRAST:  167mL OMNIPAQUE IOHEXOL 300 MG/ML  SOLN COMPARISON:  CT renal study dated March 15, 2018. FINDINGS: Lower chest: There is a small 4 mm pulmonary nodule in the left lower lobe (axial series 4, image 10). This nodule was not clearly present on prior CT. The heart size is normal. Hepatobiliary: There is a 1.2 cm hypoattenuating area in the left hepatic lobe (axial series 2, image 23). This was not clearly present on the prior non-contrast enhanced examination. The gallbladder is distended. There is no obvious filling defect. No CT evidence for acute cholecystitis. Pancreas: Normal contours without ductal dilatation. No peripancreatic fluid collection. Spleen: The spleen is not significantly enlarged. 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 Adrenals/Urinary Tract: --Adrenal glands: No adrenal hemorrhage. --Right kidney/ureter: No hydronephrosis or perinephric hematoma. --Left kidney/ureter: No hydronephrosis or perinephric hematoma. --Urinary bladder: Unremarkable. Stomach/Bowel: --Stomach/Duodenum: No hiatal hernia or other gastric abnormality. Normal duodenal course and caliber. --Small bowel: No dilatation or inflammation. --Colon: There is an area of circumferential wall  thickening involving the distal descending colon. This area of wall thickening measures approximately 5.3 x 3.8 cm and has progressed since the prior study. There are enlarged adjacent regional lymph nodes. This mass is obstructive as the colon proximal to this point is significantly dilated with air-fluid levels. For example, the cecum is dilated and measures approximately 8.3 cm in diameter. --Appendix: Not visualized. No right lower quadrant inflammation or free fluid. Vascular/Lymphatic: Normal course and caliber of the major abdominal vessels. --No retroperitoneal lymphadenopathy. --again noted are findings of a missed the mesentery, similar to prior study. There is a pathologically enlarged 1.6 cm lymph node in the left mid abdomen along the drainage of the IMV (axial series 2, image 53). --No pelvic or inguinal lymphadenopathy. Reproductive: Status post hysterectomy. No adnexal mass. Other: No ascites or free air. The abdominal wall is normal. Musculoskeletal. No acute displaced fractures. IMPRESSION: 1. Obstructing 5.3 x 3.8 cm mass in the distal descending colon. There is significant dilatation of the colon proximal to this mass consistent with a moderate to high-grade level of obstruction. The colon beyond this mass is significantly decompressed. This mass is consistent with colorectal carcinoma until proven otherwise. Surgical consultation is recommended. 2. New hypoattenuating 1.2 cm mass in the left hepatic lobe is concerning for hepatic metastatic disease. 3. Pathologically enlarged 1.6 cm lymph node along the course of the IMV is consistent with nodal metastatic disease. 4. Small 4 mm pulmonary nodule in the left lower lobe, not clearly seen on prior studies. Attention on follow-up examinations is recommended as metastatic disease to the lungs cannot be excluded. 5. Distended gallbladder without evidence for acute cholecystitis. These results will be called to the ordering clinician or representative  by the Radiologist Assistant, and communication documented in the PACS or zVision Dashboard. Electronically Signed: By: Constance Holster M.D. On: 08/17/2019 17:36    ____________________________________________  PROCEDURES   Procedure(s) performed (including Critical Care):  Procedures  ____________________________________________  INITIAL IMPRESSION / MDM / [place] / ED COURSE  As part of my medical decision making, I reviewed the following data within the [place] notes reviewed and incorporated, Old chart reviewed, Notes from prior ED visits, and [place] Controlled Substance Database       *Bostynn Hitchings Cowgill was evaluated in Emergency Department on 08/18/2019 for the symptoms described in the history of present illness. She was evaluated in the context of the global COVID-19 pandemic, which necessitated consideration  that the patient might be at risk for infection with the SARS-CoV-2 virus that causes COVID-19. Institutional protocols and algorithms that pertain to the evaluation of patients at risk for COVID-19 are in a state of rapid change based on information released by regulatory bodies including the CDC and federal and state organizations. These policies and algorithms were followed during the patient's care in the ED.  Some ED evaluations and interventions may be delayed as a result of limited staffing during the pandemic.*     Medical Decision Making:  78 yo F here with severe abdominal pain secondary to bowel obstruction. Unfortunately, it seems this is likely 2/2 colon CA/mass. Labs are c/w her history of vomiting and show likely reactive leukocytosis as well as hypokalemia. K replaced, IVF given. Zosyn given empirically as well. Discussed with Dr. Celine Ahr, who will admit. Unable to obtain rapid COVID due to supply shortage, so 6-24 hr TAT ordered. Pt refusing NGT at this time, is not vomiting.  ____________________________________________   FINAL CLINICAL IMPRESSION(S) / ED DIAGNOSES  Final diagnoses:  Colon obstruction ([place])  Colonic mass  Hypokalemia     MEDICATIONS GIVEN DURING THIS VISIT:  Medications  potassium chloride 10 mEq in 100 mL IVPB ( Intravenous Rate/Dose Change 08/18/19 0143)  enoxaparin (LOVENOX) injection 40 mg (has no administration in time range)  lactated ringers infusion ( Intravenous New Bag/Given 08/18/19 0009)  piperacillin-tazobactam (ZOSYN) IVPB 3.375 g (has no administration in time range)  ketorolac (TORADOL) 30 MG/ML injection 15 mg (has no administration in time range)  acetaminophen (TYLENOL) tablet 650 mg (has no administration in time range)    Or  acetaminophen (TYLENOL) suppository 650 mg (has no administration in time range)  HYDROmorphone (DILAUDID) injection 0.5 mg (0.5 mg Intravenous Given 08/18/19 0147)  ondansetron (ZOFRAN-ODT) disintegrating tablet 4 mg (has no administration in time range)    Or  ondansetron (ZOFRAN) injection 4 mg (has no administration in time range)  pantoprazole (PROTONIX) injection 40 mg (40 mg Intravenous Given 08/18/19 0002)  neomycin (MYCIFRADIN) tablet 1,000 mg (has no administration in time range)  erythromycin (E-MYCIN) tablet 500 mg (has no administration in time range)  metoprolol tartrate (LOPRESSOR) injection 5 mg (has no administration in time range)  magnesium citrate solution 1 Bottle (1 Bottle Oral Given 08/18/19 0001)  sodium chloride 0.9 % bolus 1,000 mL (0 mLs Intravenous Stopped 08/17/19 2256)  ondansetron (ZOFRAN) injection 4 mg (4 mg Intravenous Given 08/17/19 1903)  ondansetron (ZOFRAN) injection 4 mg (4 mg Intravenous Given 08/17/19 2244)  morphine 4 MG/ML injection 4 mg (4 mg Intravenous Given 08/17/19 2253)  magnesium sulfate IVPB 2 g 50 mL (0 g Intravenous Stopped 08/18/19 0111)  piperacillin-tazobactam (ZOSYN) IVPB 3.375 g (0 g Intravenous Stopped 08/18/19 0040)  LORazepam (ATIVAN) injection 0.5 mg (0.5 mg Intravenous Given  08/18/19 0002)     ED Discharge Orders    None       Note:  This document was prepared using Dragon voice recognition software and may include unintentional dictation errors.   Duffy Bruce, MD 08/18/19 [phone]

## 2019-08-18 NOTE — H&P (Signed)
Courtney Carpenito DO:[id number] [birth year]     HPI: This 78 year old woman was admitted last night with a 5-day history of progressive postprandial abdominal pain, abdominal distention and worsening constipation.  She is accompanied today by her daughter, Courtney Jefferson.  The patient reports dating back to April of this year she had noticed a change in bowel habits.  She did reportedly see her PCP at that time and a plain film obtained.  She is made use of MiraLAX on an as-needed basis.  This is usually resulted in a bowel movement, usually loose but occasionally formed.  Beginning 5 days ago she noted "growling" after meals that become more notable in the last several days.  There is no history of vomiting.  I cared for the patient's husband about 8 years ago when he had gastric cancer.    Medications Prior to Admission  Medication Sig Dispense Refill Last Dose  . acetaminophen (TYLENOL) 325 MG tablet Take 2 tablets (650 mg total) by mouth every 6 (six) hours as needed. 60 tablet 0 08/16/2019 at Unknown time  . BLACK COHOSH PO Take 1 tablet by mouth 2 (two) times daily.    08/16/2019 at Unknown time  . Calcium Carbonate-Vitamin D 600-400 MG-UNIT tablet Take 1 tablet by mouth 2 (two) times daily.    08/16/2019 at Unknown time  . triamterene-hydrochlorothiazide (MAXZIDE-25) 37.5-25 MG tablet Take 1 tablet by mouth daily.    08/16/2019 at Unknown time   No Known Allergies Past Medical History:  Diagnosis Date  . Hypertension    Past Surgical History:  Procedure Laterality Date  . ABDOMINAL HYSTERECTOMY     partial   Social History   Socioeconomic History  . Marital status: Widowed    Spouse name: Not on file  . Number of children: Not on file  . Years of education: Not on file  . Highest education level: Not on file  Occupational History  . Not on file  Social Needs  . Financial resource strain: Not on file  . Food insecurity    Worry: Not on file    Inability: Not on file  .  Transportation needs    Medical: Not on file    Non-medical: Not on file  Tobacco Use  . Smoking status: Former Research scientist (life sciences)  . Smokeless tobacco: Never Used  Substance and Sexual Activity  . Alcohol use: No  . Drug use: No  . Sexual activity: Not Currently    Birth control/protection: Surgical  Lifestyle  . Physical activity    Days per week: Not on file    Minutes per session: Not on file  . Stress: Not on file  Relationships  . Social Herbalist on phone: Not on file    Gets together: Not on file    Attends religious service: Not on file    Active member of club or organization: Not on file    Attends meetings of clubs or organizations: Not on file    Relationship status: Not on file  . Intimate partner violence    Fear of current or ex partner: Not on file    Emotionally abused: Not on file    Physically abused: Not on file    Forced sexual activity: Not on file  Other Topics Concern  . Not on file  Social History Narrative  . Not on file   Social History   Social History Narrative  . Not on file     ROS: Negative.  PE: HEENT: Negative.  No supraclavicular adenopathy.  Lungs: Clear.  Cardio: RR.  Abdomen: Distended, mildly firm, mild diffuse tenderness.  Bowel sounds present.  Extremities: No evidence of peripheral edema.  Pulses: Femoral and PT pulses 2+.   Laboratory/imaging: CT scan obtained yesterday afternoon shows evidence of a obstruction in the proximal sigmoid colon with dilatation of the colon proximal to this.  The cecum was dilated to about 9 cm.  Distal bowel is collapsed.  A new 1.2 cm lesion is noted in the left hepatic lobe.  Evidence of mesenteric adenopathy.  She is in the  CBC this morning showed an improvement in the white blood cell count at 12,200, previously 16,400.  Hemoglobin at 13, near baseline.  14.1 on admission. Electrolytes are notable for a depressed potassium of 2.7.  Normal renal function with a estimated GFR  greater than 60.  Hemoglobin A1c on May 08, 2019 was elevated at 6.3.  Liver function studies are normal.  The CT scan from admission as well as a CT study for stone on March 15, 2018 reviewed.  Evidence of obstructing mass in the proximal sigmoid colon.  Suspicion for a new left lobe lesion.    Assessment: Obstructing carcinoma of the sigmoid colon.  Plan:  Options for management reviewed: Palliative care versus surgical resection.  Likely temporary stoma formation based on the marked disparity between the proximal and distal bowel.  The patient and her daughter after discussion are amenable to proceed with surgery.  Approximately 1 hour spent during admission the majority of which was discussing the diagnosis and options for management.   Proceed with colectomy and temporary colostomy.    Courtney Jefferson 08/18/2019

## 2019-08-18 NOTE — Progress Notes (Signed)
Pharmacy Antibiotic Note  Courtney Jefferson is a 78 y.o. female admitted on 08/17/2019 with IAI.  Pharmacy has been consulted for Zosyn dosing.  Plan: Zosyn 3.375g IV q8h (4 hour infusion).  Height: 5\' 3"  (160 cm) Weight: 145 lb (65.8 kg) IBW/kg (Calculated) : 52.4  Temp (24hrs), Avg:98 F (36.7 C), Min:97.9 F (36.6 C), Max:98.1 F (36.7 C)  Recent Labs  Lab 08/17/19 1900 08/17/19 2304  WBC 16.4*  --   CREATININE 0.86  --   LATICACIDVEN 2.0* 1.3    Estimated Creatinine Clearance: 49.2 mL/min (by C-G formula based on SCr of 0.86 mg/dL).    No Known Allergies  Antimicrobials this admission: Zosyn 11/14 >>   Dose adjustments this admission:  Microbiology results:  BCx:   UCx:    Sputum:    MRSA PCR:   Thank you for allowing pharmacy to be a part of this patient's care.  Hart Robinsons A 08/18/2019 4:38 AM

## 2019-08-18 NOTE — Anesthesia Preprocedure Evaluation (Addendum)
Anesthesia Evaluation  Patient identified by MRN, date of birth, ID band Patient awake    Reviewed: Allergy & Precautions, H&P , NPO status , Patient's Chart, lab work & pertinent test results  History of Anesthesia Complications (+) PONV and history of anesthetic complications  Airway Mallampati: III  TM Distance: <3 FB Neck ROM: limited    Dental  (+) Chipped   Pulmonary neg pulmonary ROS, neg shortness of breath, former smoker,           Cardiovascular Exercise Tolerance: Good hypertension, (-) angina(-) Past MI and (-) DOE      Neuro/Psych negative neurological ROS  negative psych ROS   GI/Hepatic negative GI ROS, Neg liver ROS,   Endo/Other  negative endocrine ROS  Renal/GU      Musculoskeletal   Abdominal   Peds  Hematology negative hematology ROS (+)   Anesthesia Other Findings SBO  Past Medical History: No date: Hypertension  Past Surgical History: No date: ABDOMINAL HYSTERECTOMY     Comment:  partial  BMI    Body Mass Index: 25.69 kg/m      Reproductive/Obstetrics negative OB ROS                             Anesthesia Physical Anesthesia Plan  ASA: III  Anesthesia Plan: General ETT, Rapid Sequence and Cricoid Pressure   Post-op Pain Management:    Induction: Intravenous  PONV Risk Score and Plan: Ondansetron, Dexamethasone, Midazolam and Treatment may vary due to age or medical condition  Airway Management Planned: Oral ETT  Additional Equipment:   Intra-op Plan:   Post-operative Plan: Extubation in OR  Informed Consent: I have reviewed the patients History and Physical, chart, labs and discussed the procedure including the risks, benefits and alternatives for the proposed anesthesia with the patient or authorized representative who has indicated his/her understanding and acceptance.     Dental Advisory Given  Plan Discussed with: Anesthesiologist,  CRNA and Surgeon  Anesthesia Plan Comments: (Patient consented for risks of anesthesia including but not limited to:  - adverse reactions to medications - damage to teeth, lips or other oral mucosa - sore throat or hoarseness - Damage to heart, brain, lungs or loss of life  Patient voiced understanding.)       Anesthesia Quick Evaluation

## 2019-08-18 NOTE — Anesthesia Postprocedure Evaluation (Signed)
Anesthesia Post Note  Patient: Courtney Jefferson  Procedure(s) Performed: Exploratory Laparotomy (N/A ) COLECTOMY WITH COLOSTOMY CREATION/HARTMANN PROCEDURE  Patient location during evaluation: PACU Anesthesia Type: General Level of consciousness: awake and alert Pain management: pain level controlled Vital Signs Assessment: post-procedure vital signs reviewed and stable Respiratory status: spontaneous breathing, nonlabored ventilation, respiratory function stable and patient connected to nasal cannula oxygen Cardiovascular status: blood pressure returned to baseline and stable Postop Assessment: no apparent nausea or vomiting Anesthetic complications: no     Last Vitals:  Vitals:   08/18/19 1707 08/18/19 1722  BP: 114/64 118/63  Pulse: 83 81  Resp: 15 20  Temp: 36.7 C   SpO2: 100% 100%    Last Pain:  Vitals:   08/18/19 1722  TempSrc:   PainSc: 0-No pain                 Precious Haws Piscitello

## 2019-08-18 NOTE — Anesthesia Postprocedure Evaluation (Signed)
Anesthesia Post Note  Patient: Courtney Jefferson  Procedure(s) Performed: Exploratory Laparotomy (N/A ) COLECTOMY WITH COLOSTOMY CREATION/HARTMANN PROCEDURE  Anesthesia Type: General     Last Vitals:  Vitals:   08/18/19 1636 08/18/19 1650  BP: 117/63   Pulse: 98   Resp: 20   Temp: 36.8 C 36.6 C  SpO2: 100%     Last Pain:  Vitals:   08/18/19 1636  TempSrc:   PainSc: Asleep                 Dierdre Forth Braylin Formby

## 2019-08-18 NOTE — Op Note (Addendum)
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  Postoperative diagnosis: Same.  Operative procedure: Exploratory celiotomy, end colostomy, Hartmann procedure, resection of left colon.  Operating surgeon: Hervey Ard, MD  Assistant: Fredirick Maudlin, MD  Anesthesia: General endotracheal; Exparel: 20 cc with 0.25% Marcaine, plain, 30 cc local anesthesia.  Estimated blood loss: Less than 100 cc.  Clinical note: This 78 year old woman has had a month-long history of difficulty with constipation.  This became worse in the last week where she would note gurgling after all meals.  Last night she developed pain for the first time.  She had a CT scan showing evidence of an obstructing lesion at the junction of the descending and sigmoid colon as well as a new 1.2 cm nodule in the left lobe of the liver suggestive for metastatic disease.  She was felt to be a candidate for exploration even with her low serum potassium based on her clinical findings.  Operative note: The patient underwent general endotracheal anesthesia without difficulty.  A Foley catheter was placed by myself.  She received 1 g of ertapenem IV.  The abdomen was cleansed with ChloraPrep and draped.  The above-mentioned local anesthetic was infiltrated about 5 cm away from the planned midline incision.  The skin was incised sharply and the remaining dissection completed with electrocautery.  The fascia was carefully entered and exploration of the abdomen showed no significant adhesions.  A large Alexis wound protector was placed.  There was a hard mass in the distal descending/proximal sigmoid.  It was elected to decompress the bowel.  Original plans for doing this through the cecum were thwarted by the distal and near retroperitoneal location of the appendix.  The mid transverse colon was freed over the tinea and a 3-0 Vicryl pursestring suture placed.  The suction cannula was then passed  proximally and distally returning about 700 cc of liquid stool and flatus.  This significantly improved exposure.  The pursestring suture was snugged and then reinforced with a running 3-0 silk seromuscular layer.  Inspection at the end of the procedure showed this to be watertight.  Attention was turned to mobilizing the left colon.  The white line of Toldt was identified distal to the tumor and this was opened.  The LigaSure device was used to mobilize the colon.  The colon was transected distal to the tumor about 8 cm making use of a GIA stapler.  The mesentery was then divided and the the left ureter was identified and protected.  The splenic flexure was then taken down and the distal transverse colon separated from the over lying omentum with the LigaSure.  The specimen was resected with an additional 4 x 5 cm piece of mesentery which appeared to have a single hard large node within it.  Inspection of the left upper quadrant showed no bleeding from the area of the spleen.  A wet gauze pack was placed in this area and second inspection was good.  Palpation of the liver showed a hard mass in the left lobe on the anterior surface consistent with metastatic foci.  The distal bowel was sewn to the anterior abdominal wall with interrupted 2-0 Prolene sutures x2.  Palpation of the liver showed a hard irregular marble sized mass in the left lobe on the anterior surface consistent with metastatic disease.  It was an isolated finding and corresponded to the preoperative CT scan.  The bowel was divided in the distal transverse colon.  The transverse colon easily reached  the previously marked stoma site.  A 2-1/2 cm disc of skin was resected and the fascia incised in a cruciate fashion.  The bowel was then brought out through the abdominal wall and sutured to the anterior rectus sheath with interrupted 3-0 Vicryl sutures.  The abdomen was irrigated with 2 L of normal saline for the small amount of spillage that  occurred during the decompression.  Good hemostasis was noted.  The surgeon's gown and gloves were changed and fresh instruments were used for closure.  The peritoneum was closed with a running 2-0 Vicryl suture.  Fascia was then closed with interrupted 0 Maxon figure-of-eight sutures.  The wound was irrigated and the adipose layer closed with a running 2-0 Vicryl suture.  Staples were applied to the skin.  A honeycomb dressing was placed.  The colostomy was matured matured in the fashion of Brooke.  A stomal plate was cut to appropriate size and placed.     With the sponge, tape and instrument count correct the patient remained hemodynamically stable through the procedure and was taken to the recovery room in stable condition.

## 2019-08-18 NOTE — Progress Notes (Signed)
   08/18/19 1100  Clinical Encounter Type  Visited With Patient and family together  Visit Type Initial;Psychological support;Spiritual support  Referral From Nurse  Spiritual Encounters  Spiritual Needs Literature;Prayer;Emotional  Stress Factors  Patient Stress Factors Health changes  Family Stress Factors Loss of control  Ch received an OR for spiritual/emotional support. Daughter Courtney Jefferson was visibly upset with the mother's diagnosis. Ch read Serenity prayer with both the patient and daughter and prayed for the family. [name] relies on faith and told the ch that with her father's illness and death, God was the reason that made them able to survive and go through all that. Follow up visit is recommended after surgery. The surgery is to take place this noon.

## 2019-08-18 NOTE — Anesthesia Post-op Follow-up Note (Signed)
Anesthesia QCDR form completed.        

## 2019-08-18 NOTE — Transfer of Care (Signed)
Immediate Anesthesia Transfer of Care Note  Patient: Courtney Jefferson  Procedure(s) Performed: Exploratory Laparotomy (N/A ) COLECTOMY WITH COLOSTOMY CREATION/HARTMANN PROCEDURE  Patient Location: PACU  Anesthesia Type:General  Level of Consciousness: awake, alert , oriented and drowsy  Airway & Oxygen Therapy: Patient Spontanous Breathing and Patient connected to face mask oxygen  Post-op Assessment: Report given to RN, Post -op Vital signs reviewed and stable and Patient moving all extremities  Post vital signs: Reviewed  Last Vitals:  Vitals Value Taken Time  BP 117/63 08/18/19 1637  Temp 36.6 C 08/18/19 1650  Pulse 88 08/18/19 1650  Resp 18 08/18/19 1650  SpO2 100 % 08/18/19 1650  Vitals shown include unvalidated device data.  Last Pain:  Vitals:   08/18/19 1636  TempSrc:   PainSc: Asleep         Complications: No apparent anesthesia complications

## 2019-08-18 NOTE — ED Notes (Addendum)
ED TO INPATIENT HANDOFF REPORT  ED Nurse Name and Phone #: Karena Addison [phone]  S Name/Age/Gender Courtney Jefferson 78 y.o. female Room/Bed: ED08A/ED08A  Code Status   Code Status: Full Code  Home/SNF/Other Home Patient oriented to: self, place, time and situation Is this baseline? Yes   Triage Complete: Triage complete  Chief Complaint Bowel Blockage  Triage Note Pt comes via P[facility] from [facility] with c/o abdominal pain. Pt states this has been going on for few days.  Pt states some nausea. Pt denies vomiting and diarrhea.  Pt has paperwork that shows results from CT scan. Pt vomiting and very tearful in triage.      Allergies No Known Allergies  Level of Care/Admitting Diagnosis ED Disposition    ED Disposition Condition [facility] Area: [place] [[id number]]  Level of Care: Med-Surg [16]  Covid Evaluation: Asymptomatic Screening Protocol (No Symptoms)  Diagnosis: Colonic obstruction [facility] 2) [id number]  Admitting Physician: Darreld Mclean  Attending Physician: Darreld Mclean  Estimated length of stay: 5 - 7 days  Certification:: I certify this patient will need inpatient services for at least 2 midnights  Bed request comments: 2Cm if available  PT Class (Do Not Modify): Inpatient [101]  PT Acc Code (Do Not Modify): Private [1]       B Medical/Surgery History Past Medical History:  Diagnosis Date  . Hypertension    Past Surgical History:  Procedure Laterality Date  . ABDOMINAL HYSTERECTOMY     partial     A IV Location/Drains/Wounds Patient Lines/Drains/Airways Status   Active Line/Drains/Airways    Name:   Placement date:   Placement time:   Site:   Days:   Peripheral IV 08/17/19 Left Antecubital   08/17/19    1705    Antecubital   1          Intake/Output Last 24 hours  Intake/Output Summary (Last 24 hours) at 08/18/2019 0256 Last data filed at 08/17/2019 2256 Gross per 24 hour  Intake 1000 ml   Output -  Net 1000 ml    Labs/Imaging Results for orders placed or performed during the hospital encounter of 08/17/19 (from the past 48 hour(s))  Comprehensive metabolic panel     Status: Abnormal   Collection Time: 08/17/19  7:00 PM  Result Value Ref Range   Sodium 134 (L) 135 - 145 mmol/L   Potassium 2.8 (L) 3.5 - 5.1 mmol/L   Chloride 98 98 - 111 mmol/L   CO2 20 (L) 22 - 32 mmol/L   Glucose, Bld 144 (H) 70 - 99 mg/dL   BUN 15 8 - 23 mg/dL   Creatinine, Ser 0.86 0.44 - 1.00 mg/dL   Calcium 9.3 8.9 - 10.3 mg/dL   Total Protein 8.5 (H) 6.5 - 8.1 g/dL   Albumin 4.3 3.5 - 5.0 g/dL   AST 18 15 - 41 U/L   ALT 17 0 - 44 U/L   Alkaline Phosphatase 89 38 - 126 U/L   Total Bilirubin 0.9 0.3 - 1.2 mg/dL   GFR calc non Af Amer >60 >60 mL/min   GFR calc Af Amer >60 >60 mL/min   Anion gap 16 (H) 5 - 15    Comment: Performed at [facility], [address] [place], [place] [address]  Lipase, blood     Status: None   Collection Time: 08/17/19  7:00 PM  Result Value Ref Range   Lipase 25 11 - 51 U/L  Comment: Performed at [facility], [address] [place], [place] [address]  Lactic acid, plasma     Status: Abnormal   Collection Time: 08/17/19  7:00 PM  Result Value Ref Range   Lactic Acid, Venous 2.0 (HH) 0.5 - 1.9 mmol/L    Comment: CRITICAL RESULT CALLED TO, READ BACK BY AND VERIFIED WITH ANN CALES ON 08/17/19 AT 1939 [facility] Performed at [facility], [address] [place], [place] [address]   CBC with Differential     Status: Abnormal   Collection Time: 08/17/19  7:00 PM  Result Value Ref Range   WBC 16.4 (H) 4.0 - 10.5 K/uL   RBC 4.87 3.87 - 5.11 MIL/uL   Hemoglobin 14.1 12.0 - 15.0 g/dL   HCT 43.6 36.0 - 46.0 %   MCV 89.5 80.0 - 100.0 fL   MCH 29.0 26.0 - 34.0 pg   MCHC 32.3 30.0 - 36.0 g/dL   RDW 13.2 11.5 - 15.5 %   Platelets 331 150 - 400 K/uL   nRBC 0.0 0.0 - 0.2 %   Neutrophils Relative % 78 %   Neutro Abs 12.7 (H) 1.7 - 7.7 K/uL    Lymphocytes Relative 16 %   Lymphs Abs 2.7 0.7 - 4.0 K/uL   Monocytes Relative 5 %   Monocytes Absolute 0.8 0.1 - 1.0 K/uL   Eosinophils Relative 0 %   Eosinophils Absolute 0.0 0.0 - 0.5 K/uL   Basophils Relative 0 %   Basophils Absolute 0.1 0.0 - 0.1 K/uL   Immature Granulocytes 1 %   Abs Immature Granulocytes 0.08 (H) 0.00 - 0.07 K/uL    Comment: Performed at [facility], [address] [place], [place] [address]  Protime-INR     Status: None   Collection Time: 08/17/19  7:00 PM  Result Value Ref Range   Prothrombin Time 13.6 11.4 - 15.2 seconds   INR 1.1 0.8 - 1.2    Comment: (NOTE) INR goal varies based on device and disease states. Performed at [facility], [address] [place], [place] [address]   Magnesium     Status: None   Collection Time: 08/17/19  7:00 PM  Result Value Ref Range   Magnesium 2.2 1.7 - 2.4 mg/dL    Comment: Performed at [facility], [address] [place], [place] [address]  Lactic acid, plasma     Status: None   Collection Time: 08/17/19 11:04 PM  Result Value Ref Range   Lactic Acid, Venous 1.3 0.5 - 1.9 mmol/L    Comment: Performed at [facility], [address] [place], [place] [address]   Ct Abdomen Pelvis W Contrast  Addendum Date: 08/17/2019   ADDENDUM REPORT: 08/17/2019 17:47 ADDENDUM: These results were called by telephone at the time of interpretation on 08/17/2019 at 5:47 pm to provider Dr. Wynetta Emery, who verbally acknowledged these results. Electronically Signed   By: Constance Holster M.D.   On: 08/17/2019 17:47   Result Date: 08/17/2019 CLINICAL DATA:  Constipation EXAM: CT ABDOMEN AND PELVIS WITH CONTRAST TECHNIQUE: Multidetector CT imaging of the abdomen and pelvis was performed using the standard protocol following bolus administration of intravenous contrast. CONTRAST:  167mL OMNIPAQUE IOHEXOL 300 MG/ML  SOLN COMPARISON:  CT renal study dated March 15, 2018. FINDINGS: Lower chest:  There is a small 4 mm pulmonary nodule in the left lower lobe (axial series 4, image 10). This nodule was not clearly present on prior CT. The heart size is  normal. Hepatobiliary: There is a 1.2 cm hypoattenuating area in the left hepatic lobe (axial series 2, image 23). This was not clearly present on the prior non-contrast enhanced examination. The gallbladder is distended. There is no obvious filling defect. No CT evidence for acute cholecystitis. Pancreas: Normal contours without ductal dilatation. No peripancreatic fluid collection. Spleen: The spleen is not significantly enlarged. 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 Adrenals/Urinary Tract: --Adrenal glands: No adrenal hemorrhage. --Right kidney/ureter: No hydronephrosis or perinephric hematoma. --Left kidney/ureter: No hydronephrosis or perinephric hematoma. --Urinary bladder: Unremarkable. Stomach/Bowel: --Stomach/Duodenum: No hiatal hernia or other gastric abnormality. Normal duodenal course and caliber. --Small bowel: No dilatation or inflammation. --Colon: There is an area of circumferential wall thickening involving the distal descending colon. This area of wall thickening measures approximately 5.3 x 3.8 cm and has progressed since the prior study. There are enlarged adjacent regional lymph nodes. This mass is obstructive as the colon proximal to this point is significantly dilated with air-fluid levels. For example, the cecum is dilated and measures approximately 8.3 cm in diameter. --Appendix: Not visualized. No right lower quadrant inflammation or free fluid. Vascular/Lymphatic: Normal course and caliber of the major abdominal vessels. --No retroperitoneal lymphadenopathy. --again noted are findings of a missed the mesentery, similar to prior study. There is a pathologically enlarged 1.6 cm lymph node in the left mid abdomen along the drainage of the IMV (axial  series 2, image 53). --No pelvic or inguinal lymphadenopathy. Reproductive: Status post hysterectomy. No adnexal mass. Other: No ascites or free air. The abdominal wall is normal. Musculoskeletal. No acute displaced fractures. IMPRESSION: 1. Obstructing 5.3 x 3.8 cm mass in the distal descending colon. There is significant dilatation of the colon proximal to this mass consistent with a moderate to high-grade level of obstruction. The colon beyond this mass is significantly decompressed. This mass is consistent with colorectal carcinoma until proven otherwise. Surgical consultation is recommended. 2. New hypoattenuating 1.2 cm mass in the left hepatic lobe is concerning for hepatic metastatic disease. 3. Pathologically enlarged 1.6 cm lymph node along the course of the IMV is consistent with nodal metastatic disease. 4. Small 4 mm pulmonary nodule in the left lower lobe, not clearly seen on prior studies. Attention on follow-up examinations is recommended as metastatic disease to the lungs cannot be excluded. 5. Distended gallbladder without evidence for acute cholecystitis. These results will be called to the ordering clinician or representative by the Radiologist Assistant, and communication documented in the PACS or zVision Dashboard. Electronically Signed: By: Constance Holster M.D. On: 08/17/2019 17:36    Pending Labs Unresulted Labs (From admission, onward)    Start     Ordered   08/24/19 0500  Creatinine, serum  (enoxaparin (LOVENOX)    CrCl >/= 30 ml/min)  Weekly,   STAT    Comments: while on enoxaparin therapy    08/17/19 2330   08/18/19 0500  CBC  Tomorrow morning,   STAT     08/17/19 2330   08/18/19 0500  Comprehensive metabolic panel  Tomorrow morning,   STAT     08/17/19 2330   08/18/19 0500  Magnesium  Tomorrow morning,   STAT     08/17/19 2330   08/18/19 0500  Phosphorus  Tomorrow morning,   STAT     08/17/19 2330   08/18/19 0133  SARS CORONAVIRUS 2 (TAT 6-24 HRS) Nasopharyngeal  Nasopharyngeal Swab  (Asymptomatic/Tier 2 Patients Labs)  Once,   STAT    Question Answer Comment  Is this test for diagnosis or screening Screening   Symptomatic for COVID-19 as defined by CDC No   Hospitalized for COVID-19 No   Admitted to ICU for COVID-19 No   Previously tested for COVID-19 No   Resident in a congregate (group) care setting No   Employed in healthcare setting No   Pregnant No      08/18/19 0132          Vitals/Pain Today's Vitals   08/18/19 0030 08/18/19 0100 08/18/19 0130 08/18/19 0200  BP: (!) 149/79 138/76 129/75 126/72  Pulse: 99 94 87 93  Resp:      Temp:      TempSrc:      SpO2: 98% 95% 99% 94%  Weight:      Height:      PainSc:        Isolation Precautions No active isolations  Medications Medications  potassium chloride 10 mEq in 100 mL IVPB (10 mEq Intravenous New Bag/Given 08/18/19 0251)  enoxaparin (LOVENOX) injection 40 mg (0 mg Subcutaneous Hold 08/18/19 0211)  lactated ringers infusion ( Intravenous New Bag/Given 08/18/19 0009)  piperacillin-tazobactam (ZOSYN) IVPB 3.375 g (has no administration in time range)  ketorolac (TORADOL) 30 MG/ML injection 15 mg (has no administration in time range)  acetaminophen (TYLENOL) tablet 650 mg (has no administration in time range)    Or  acetaminophen (TYLENOL) suppository 650 mg (has no administration in time range)  HYDROmorphone (DILAUDID) injection 0.5 mg (0.5 mg Intravenous Given 08/18/19 0147)  ondansetron (ZOFRAN-ODT) disintegrating tablet 4 mg (has no administration in time range)    Or  ondansetron (ZOFRAN) injection 4 mg (has no administration in time range)  pantoprazole (PROTONIX) injection 40 mg (40 mg Intravenous Given 08/18/19 0002)  neomycin (MYCIFRADIN) tablet 1,000 mg (has no administration in time range)  erythromycin (E-MYCIN) tablet 500 mg (has no administration in time range)  metoprolol tartrate (LOPRESSOR) injection 5 mg (has no administration in time range)  magnesium  citrate solution 1 Bottle (1 Bottle Oral Given 08/18/19 0001)  sodium chloride 0.9 % bolus 1,000 mL (0 mLs Intravenous Stopped 08/17/19 2256)  ondansetron (ZOFRAN) injection 4 mg (4 mg Intravenous Given 08/17/19 1903)  ondansetron (ZOFRAN) injection 4 mg (4 mg Intravenous Given 08/17/19 2244)  morphine 4 MG/ML injection 4 mg (4 mg Intravenous Given 08/17/19 2253)  magnesium sulfate IVPB 2 g 50 mL (0 g Intravenous Stopped 08/18/19 0111)  piperacillin-tazobactam (ZOSYN) IVPB 3.375 g (0 g Intravenous Stopped 08/18/19 0040)  LORazepam (ATIVAN) injection 0.5 mg (0.5 mg Intravenous Given 08/18/19 0002)    Mobility manual wheelchair Low fall risk   Focused Assessments    R Recommendations: See Admitting Provider Note  Report given to: Jeanett Schlein, RN

## 2019-08-19 ENCOUNTER — Encounter: Payer: Self-pay | Admitting: General Surgery

## 2019-08-19 LAB — CBC WITH DIFFERENTIAL/PLATELET
Abs Immature Granulocytes: 0.04 10*3/uL (ref 0.00–0.07)
Basophils Absolute: 0 10*3/uL (ref 0.0–0.1)
Basophils Relative: 0 %
Eosinophils Absolute: 0 10*3/uL (ref 0.0–0.5)
Eosinophils Relative: 0 %
HCT: 35.2 % — ABNORMAL LOW (ref 36.0–46.0)
Hemoglobin: 11.6 g/dL — ABNORMAL LOW (ref 12.0–15.0)
Immature Granulocytes: 0 %
Lymphocytes Relative: 8 %
Lymphs Abs: 1.1 10*3/uL (ref 0.7–4.0)
MCH: 29.4 pg (ref 26.0–34.0)
MCHC: 33 g/dL (ref 30.0–36.0)
MCV: 89.3 fL (ref 80.0–100.0)
Monocytes Absolute: 0.8 10*3/uL (ref 0.1–1.0)
Monocytes Relative: 6 %
Neutro Abs: 11.2 10*3/uL — ABNORMAL HIGH (ref 1.7–7.7)
Neutrophils Relative %: 86 %
Platelets: 244 10*3/uL (ref 150–400)
RBC: 3.94 MIL/uL (ref 3.87–5.11)
RDW: 13.6 % (ref 11.5–15.5)
WBC: 13.1 10*3/uL — ABNORMAL HIGH (ref 4.0–10.5)
nRBC: 0 % (ref 0.0–0.2)

## 2019-08-19 LAB — BASIC METABOLIC PANEL
Anion gap: 10 (ref 5–15)
BUN: 15 mg/dL (ref 8–23)
CO2: 19 mmol/L — ABNORMAL LOW (ref 22–32)
Calcium: 7.6 mg/dL — ABNORMAL LOW (ref 8.9–10.3)
Chloride: 107 mmol/L (ref 98–111)
Creatinine, Ser: 0.74 mg/dL (ref 0.44–1.00)
GFR calc Af Amer: >60 mL/min (ref >60)
GFR calc non Af Amer: >60 mL/min (ref >60)
Glucose, Bld: 109 mg/dL — ABNORMAL HIGH (ref 70–99)
Potassium: 3.1 mmol/L — ABNORMAL LOW (ref 3.5–5.1)
Sodium: 136 mmol/L (ref 135–145)

## 2019-08-19 MED ORDER — NALOXONE HCL 0.4 MG/ML IJ SOLN: 0.4000 mg | INTRAMUSCULAR | Status: DC | PRN

## 2019-08-19 MED ORDER — PANTOPRAZOLE SODIUM 40 MG PO TBEC
40.0000 mg | DELAYED_RELEASE_TABLET | Freq: Every day | ORAL | Status: DC
  Administered 2019-08-19 – 2019-08-22 (×4): 40 mg via ORAL
  Filled 2019-08-19 (×3): qty 1

## 2019-08-19 MED ORDER — SODIUM CHLORIDE 0.9% FLUSH: 9.0000 mL | INTRAVENOUS | Status: DC | PRN

## 2019-08-19 MED ORDER — PROMETHAZINE HCL 25 MG/ML IJ SOLN: 6.2500 mg | INTRAMUSCULAR | Status: DC | PRN

## 2019-08-19 MED ORDER — OXYCODONE HCL 5 MG PO TABS
2.5000 mg | ORAL_TABLET | ORAL | Status: DC | PRN
  Administered 2019-08-19 (×2): 2.5 mg via ORAL
  Filled 2019-08-19 (×2): qty 1

## 2019-08-19 MED ORDER — MORPHINE SULFATE 2 MG/ML IV SOLN: INTRAVENOUS | Status: DC

## 2019-08-19 MED ORDER — MORPHINE SULFATE (PF) 2 MG/ML IV SOLN
2.0000 mg | INTRAVENOUS | Status: DC | PRN
  Filled 2019-08-19 (×2): qty 1

## 2019-08-19 MED ORDER — LORAZEPAM 0.5 MG PO TABS: 0.5000 mg | ORAL_TABLET | Freq: Four times a day (QID) | ORAL | Status: DC | PRN

## 2019-08-19 MED ORDER — IBUPROFEN 400 MG PO TABS: 400.0000 mg | ORAL_TABLET | ORAL | Status: DC | PRN

## 2019-08-19 MED ORDER — ACETAMINOPHEN 10 MG/ML IV SOLN
1000.0000 mg | Freq: Four times a day (QID) | INTRAVENOUS | Status: AC
  Administered 2019-08-19 (×3): 1000 mg via INTRAVENOUS
  Filled 2019-08-19 (×4): qty 100

## 2019-08-19 MED ORDER — ENOXAPARIN SODIUM 40 MG/0.4ML ~~LOC~~ SOLN
40.0000 mg | SUBCUTANEOUS | Status: DC
  Administered 2019-08-19 – 2019-08-22 (×4): 40 mg via SUBCUTANEOUS
  Filled 2019-08-19 (×4): qty 0.4

## 2019-08-19 MED ORDER — POTASSIUM CHLORIDE 2 MEQ/ML IV SOLN
INTRAVENOUS | Status: DC
  Administered 2019-08-19 (×2): via INTRAVENOUS
  Filled 2019-08-19 (×4): qty 1000

## 2019-08-19 MED ORDER — ACETAMINOPHEN 325 MG PO TABS
650.0000 mg | ORAL_TABLET | ORAL | Status: DC
  Administered 2019-08-19 – 2019-08-22 (×11): 650 mg via ORAL
  Filled 2019-08-19 (×12): qty 2

## 2019-08-19 MED ORDER — ONDANSETRON HCL 4 MG/2ML IJ SOLN: 4.0000 mg | Freq: Four times a day (QID) | INTRAMUSCULAR | Status: DC | PRN

## 2019-08-19 NOTE — Progress Notes (Signed)
The patient is postop day 1 status post resection of the left colon with end colostomy and Hartmann procedure.  Very good pain control.  No nausea.  NG output less than 100 cc overnight, NG removed.  Pulmonary exam: Clear.  Cardiac exam: Regular rhythm.  Vital signs stable.  Abdomen: Soft, mild diffuse tenderness consistent with postop state.  Wound dressing dry.  Stoma functioning.  Extremity: Soft and nontender.  Laboratory studies: CBC shows a hemoglobin of 11.6 down from 13 preop, white blood cell count minimally 13,100 with left shift.  Platelet count 244,000.  Electrolytes show an improvement in the serum potassium to 3.1.  This in spite of the fact that the LR with 40 of K was not hung until this morning.  Minimal rise in the blood sugar to 109.  Preservation of renal function.  Impression: Doing well 1 day status post colectomy and colostomy.  Her urine output is good.  We will discontinue the Foley and the NG tube.  We will continue n.p.o. except for ice chips and sips of coffee.  Ambulate today.  She is required minimal narcotics and will not institute the PCA that was ordered yesterday.

## 2019-08-20 LAB — CBC WITH DIFFERENTIAL/PLATELET
Abs Immature Granulocytes: 0.05 10*3/uL (ref 0.00–0.07)
Basophils Absolute: 0 10*3/uL (ref 0.0–0.1)
Basophils Relative: 0 %
Eosinophils Absolute: 0 10*3/uL (ref 0.0–0.5)
Eosinophils Relative: 0 %
HCT: 30.9 % — ABNORMAL LOW (ref 36.0–46.0)
Hemoglobin: 10.1 g/dL — ABNORMAL LOW (ref 12.0–15.0)
Immature Granulocytes: 0 %
Lymphocytes Relative: 9 %
Lymphs Abs: 1 10*3/uL (ref 0.7–4.0)
MCH: 29.5 pg (ref 26.0–34.0)
MCHC: 32.7 g/dL (ref 30.0–36.0)
MCV: 90.4 fL (ref 80.0–100.0)
Monocytes Absolute: 0.4 10*3/uL (ref 0.1–1.0)
Monocytes Relative: 4 %
Neutro Abs: 9.9 10*3/uL — ABNORMAL HIGH (ref 1.7–7.7)
Neutrophils Relative %: 87 %
Platelets: 210 10*3/uL (ref 150–400)
RBC: 3.42 MIL/uL — ABNORMAL LOW (ref 3.87–5.11)
RDW: 13.8 % (ref 11.5–15.5)
WBC: 11.5 10*3/uL — ABNORMAL HIGH (ref 4.0–10.5)
nRBC: 0 % (ref 0.0–0.2)

## 2019-08-20 LAB — BASIC METABOLIC PANEL
Anion gap: 9 (ref 5–15)
BUN: 20 mg/dL (ref 8–23)
CO2: 21 mmol/L — ABNORMAL LOW (ref 22–32)
Calcium: 8 mg/dL — ABNORMAL LOW (ref 8.9–10.3)
Chloride: 108 mmol/L (ref 98–111)
Creatinine, Ser: 0.61 mg/dL (ref 0.44–1.00)
GFR calc Af Amer: >60 mL/min (ref >60)
GFR calc non Af Amer: >60 mL/min (ref >60)
Glucose, Bld: 112 mg/dL — ABNORMAL HIGH (ref 70–99)
Potassium: 4 mmol/L (ref 3.5–5.1)
Sodium: 138 mmol/L (ref 135–145)

## 2019-08-20 LAB — CEA: CEA: 11.3 ng/mL — ABNORMAL HIGH (ref 0.0–4.7)

## 2019-08-20 NOTE — Progress Notes (Signed)
AVSS.  Tolerated NG out well. Voiding well. No nausea. Hungry.  No SOB.  Minimal pain med requirements.   Lungs: Clear.  Cardio:  RR. ABD: Soft, minimal tenderness. BS+. Stoma: functioning well.  Wound: Clean.  Extrem: Soft.  Labs: WBC down to 11.5, still w/ left shift. HGB: Down from 11.6 to 10.2. Renal: eGFR> 60. K+ at 4.0 post replenishment.  Preop CEA:  11.3.  IMP: Doing well.  Plan: Ostomy nurse for evaluation. Start diet. Continue ambulation (which she is doing well).  D/C IV fluids.

## 2019-08-20 NOTE — Care Management Important Message (Signed)
Important Message  Patient Details  Name: Courtney Jefferson MRN: [id number] Date of Birth: 27-Jun-1941   Medicare Important Message Given:  Yes     Dannette Barbara 08/20/2019, 12:15 PM

## 2019-08-20 NOTE — Consult Note (Signed)
[place] Nurse ostomy consult note Stoma type/location: LLQ; end colostomy  Stomal assessment/size: 1 3/4" round, black Peristomal assessment: intact  Treatment options for stomal/peristomal skin: 2" barrier ring Output liquid brown, 300cc emptied during pouch change and into new pouch. Ostomy pouching: 1pc flat with 2" barrier ring, patient has abdominal distention. Once resolved may be about to go into 2 pc.   Education provided:  Explained role of ostomy nurse and creation of stoma  Explained stoma characteristics (budded, flush, color, texture, care) Demonstrated pouch change (cutting new skin barrier, measuring stoma, cleaning peristomal skin and stoma, use of barrier ring) Education on emptying when 1/3 to 1/2 full and how to empty Demonstrated use of wick to clean spout  Allowed patient to close lock and roll spout.  Daughter in the room; filmed change Patient and daughter seem very anxious about care Patient states "I'm gonna need to go a nursing home to take care of this" She didn't watch portions of the change but was conversational about care  Enrolled patient in [place] program: Yes  [place] nurse will see patient again on Wednesday this week for additional teaching 3 1pc flat and 3 ostomy barrier rings left in the room  [place], [place], [place]

## 2019-08-21 NOTE — TOC Initial Note (Signed)
Transition of Care [facility]) - Initial/Assessment Note    Patient Details  Name: Courtney Jefferson MRN: [id number] Date of Birth: 09/17/1941  Transition of Care [facility]) CM/SW Contact:    Beverly Sessions, RN Phone Number: 08/21/2019, 2:52 PM  Clinical Narrative:                 Patient POD3 status post resection of the left colon with end colostomy and Hartmann procedure  Baseline patient lives at home alone, drives, and is independent of ADLS.   At discharge patient will be staying with her daughter [name]  [facility]P [place] [place] [facility] - denies issues obtaining medications  Patient to discharge on 2 weeks lovenox.  RNCM called in rx to patients pharmacy.  Out of pocket cost $77.55.  Patient confirms she will be able to afford at discharge.   MD to order home health RN at discharge.  Patient in agreement to home health services. Patient states she does not have a preference of home health agency.  Referral made and accepted to [facility] with [place]. Patient will be staying with her daughter at discharge.  She is not sure of the address but will get it from her daughter and RNCM to follow up   Expected Discharge Plan: [place]     Patient Goals and CMS Choice Patient states their goals for this hospitalization and ongoing recovery are:: to feel better about taking care of her ostomy, and go home CMS Medicare.gov Compare Post Acute Care list provided to:: Patient Choice offered to / list presented to : Patient  Expected Discharge Plan and Services Expected Discharge Plan: [place]   Discharge Planning Services: CM Consult Post Acute Care Choice: [place] arrangements for the past 2 months: [place]: RN [place] Agency: [place] ([place]) Date HH Agency Contacted: 08/21/19   Representative spoke with at [place]: [name]  Prior Living  Arrangements/Services Living arrangements for the past 2 months: [place] Lives with:: Self Patient language and need for interpreter reviewed:: No Do you feel safe going back to the place where you live?: Yes      Need for Family Participation in Patient Care: Yes (Comment) Care giver support system in place?: Yes (comment)   Criminal Activity/Legal Involvement Pertinent to Current Situation/Hospitalization: No - Comment as needed  Activities of Daily Living Home Assistive Devices/Equipment: None ADL Screening (condition at time of admission) Patient's cognitive ability adequate to safely complete daily activities?: Yes Is the patient deaf or have difficulty hearing?: No Does the patient have difficulty seeing, even when wearing glasses/contacts?: No Does the patient have difficulty concentrating, remembering, or making decisions?: Yes Patient able to express need for assistance with ADLs?: Yes Does the patient have difficulty dressing or bathing?: No Independently performs ADLs?: Yes (appropriate for developmental age) Does the patient have difficulty walking or climbing stairs?: No Weakness of Legs: None Weakness of Arms/Hands: None  Permission Sought/Granted                  Emotional Assessment Appearance:: Appears stated age Attitude/Demeanor/Rapport: Gracious   Orientation: : Oriented to Self, Oriented to Place, Oriented to  Time, Oriented to Situation   Psych Involvement: No (comment)  Admission diagnosis:  Hypokalemia [E87.6] Colon obstruction ([place]) [  K56.609] Colonic mass [K63.89] Patient Active Problem List   Diagnosis Date Noted  . Colonic obstruction ([place]) 08/17/2019  . Hypertension 05/03/2017  . Splenic artery aneurysm ([place]) 05/03/2017   PCP:  Dion Body, MD Pharm[facility]   [id number] [place] [place] [facility] [address] [place] [address] Phone: [phone] Fax: [phone]     Social Determinants of  Health (SDOH) Interventions    Readmission Risk Interventions No flowsheet data found.

## 2019-08-21 NOTE — Progress Notes (Signed)
Tmax 99.6 last PM.VSS. Tolerated diet advance well.  Large volume stoma output. Stoma nurse input appreciated. Lungs: Clear. Cardio: RR. ABD: Mild distension, soft. Wound dressing: dry. Stoma: dusky. Extrem: Soft. The patient had originally felt she would need SND case.  She is beginning to feel more comfortable with going home with her daughter.  Reviewed plans for home Lovenox: 40mg  [place] daily x 2 weeks.   Outpatient assessment with Dr Rogue Bussing re: colon cancer.  Possible d/c tomorrow if exam and labs trending in the right direction.

## 2019-08-22 ENCOUNTER — Other Ambulatory Visit: Payer: Self-pay | Admitting: Anatomic Pathology & Clinical Pathology

## 2019-08-22 LAB — CBC WITH DIFFERENTIAL/PLATELET
Abs Immature Granulocytes: 0.05 10*3/uL (ref 0.00–0.07)
Basophils Absolute: 0 10*3/uL (ref 0.0–0.1)
Basophils Relative: 1 %
Eosinophils Absolute: 0.2 10*3/uL (ref 0.0–0.5)
Eosinophils Relative: 2 %
HCT: 30.7 % — ABNORMAL LOW (ref 36.0–46.0)
Hemoglobin: 10 g/dL — ABNORMAL LOW (ref 12.0–15.0)
Immature Granulocytes: 1 %
Lymphocytes Relative: 19 %
Lymphs Abs: 1.7 10*3/uL (ref 0.7–4.0)
MCH: 29.3 pg (ref 26.0–34.0)
MCHC: 32.6 g/dL (ref 30.0–36.0)
MCV: 90 fL (ref 80.0–100.0)
Monocytes Absolute: 0.4 10*3/uL (ref 0.1–1.0)
Monocytes Relative: 5 %
Neutro Abs: 6.2 10*3/uL (ref 1.7–7.7)
Neutrophils Relative %: 72 %
Platelets: 252 10*3/uL (ref 150–400)
RBC: 3.41 MIL/uL — ABNORMAL LOW (ref 3.87–5.11)
RDW: 13.9 % (ref 11.5–15.5)
WBC: 8.5 10*3/uL (ref 4.0–10.5)
nRBC: 0 % (ref 0.0–0.2)

## 2019-08-22 MED ORDER — ENOXAPARIN SODIUM 40 MG/0.4ML ~~LOC~~ SOLN: 40.0000 mg | SUBCUTANEOUS | 0 refills | Status: DC

## 2019-08-22 NOTE — Progress Notes (Signed)
AVSS. Tolerated diet advance well. Stoma functioning well. Minimal pain. Ambulating well. Lungs: Clear. Cardio: RR. ABD: Soft, non-tender. Wound: Clean. Stoma: Dusky but OK. Extrem: Soft. Plan: Tentative discharge this afternoon.

## 2019-08-22 NOTE — Progress Notes (Signed)
Pt will d/c to her daughter Loletha Carrow) home. Address is [address] [place] [address].

## 2019-08-22 NOTE — Consult Note (Signed)
[place] Nurse ostomy follow up Stoma type/location: LLQ, end stoma Stomal assessment/size: 1 1/2" inch; black(surgeon aware), moist, budded  Peristomal assessment: intact, close proximity to the surgical wound  Treatment options for stomal/peristomal skin: cut skin barrier to accommodate surgical wound Output liquid brown  Ostomy pouching: 1pc.flat with 2" barrier ring   Education provided:  Explained role of creation of stoma to second daughter Explained stoma characteristics (budded, flush, color, texture, care); stoma is currently black may should slough off and be pink Demonstrated pouch change (cutting new skin barrier, measuring stoma, cleaning peristomal skin and stoma, use of barrier ring); second time for patient; first time for daughter that is here today Education on emptying when 1/3 to 1/2 full and how to empty; importance to avoid leakage  Patient verbalized  use of wick to clean spout  Discussed bathing, diet, gas, medication use, constipation Discussed risk of peristomal hernia 5 pne pc flat pouches and 5 barrier rings sent home with patient for DC. [facility]   Enrolled patient in [place] Start Discharge program: Yes, patient to DC home with daughter but they will pick up samples at the patient's home  [place] Nurse will follow along with you for continued support with ostomy teaching and care [place] MSN, [place], [place], [place], [place]

## 2019-08-24 ENCOUNTER — Other Ambulatory Visit: Payer: Self-pay | Admitting: General Surgery

## 2019-08-24 MED ORDER — HYDROCODONE-ACETAMINOPHEN 5-325 MG PO TABS: 1.0000 | ORAL_TABLET | Freq: Four times a day (QID) | ORAL | 0 refills | Status: DC | PRN

## 2019-08-24 NOTE — Progress Notes (Signed)
Daughter called reporting epigastric pain this evening. Eating fair, symptoms began after peanut butter crackers.  Stoma functioning. Urinating well. Up an about. Will send RX for hydrocodone. Mylanta/ Tums for indigestion.   Phone f/u tomorrow.

## 2019-08-25 ENCOUNTER — Other Ambulatory Visit: Payer: Self-pay

## 2019-08-25 ENCOUNTER — Inpatient Hospital Stay
Admission: AD | Admit: 2019-08-25 | Discharge: 2019-08-29 | DRG: 394 | Disposition: A | Discharge status: Home Health | Payer: Medicare Other | Source: Ambulatory Visit | Attending: General Surgery | Admitting: General Surgery

## 2019-08-25 ENCOUNTER — Ambulatory Visit
Admission: RE | Admit: 2019-08-25 | Discharge: 2019-08-25 | Disposition: A | Discharge status: Home / Self Care | Payer: Medicare Other | Source: Ambulatory Visit | Attending: General Surgery | Admitting: General Surgery

## 2019-08-25 ENCOUNTER — Ambulatory Visit
Admission: RE | Admit: 2019-08-25 | Discharge: 2019-08-25 | Disposition: A | Discharge status: Home / Self Care | Payer: Medicare Other | Source: Home / Self Care | Attending: General Surgery | Admitting: General Surgery

## 2019-08-25 ENCOUNTER — Other Ambulatory Visit: Payer: Self-pay | Admitting: General Surgery

## 2019-08-25 ENCOUNTER — Encounter: Payer: Self-pay | Admitting: *Deleted

## 2019-08-25 ENCOUNTER — Inpatient Hospital Stay: Payer: Medicare Other

## 2019-08-25 DIAGNOSIS — K9189 Other postprocedural complications and disorders of digestive system: Secondary | ICD-10-CM | POA: Diagnosis present

## 2019-08-25 DIAGNOSIS — Z933 Colostomy status: Secondary | ICD-10-CM

## 2019-08-25 DIAGNOSIS — K567 Ileus, unspecified: Secondary | ICD-10-CM | POA: Diagnosis present

## 2019-08-25 DIAGNOSIS — K5732 Diverticulitis of large intestine without perforation or abscess without bleeding: Secondary | ICD-10-CM | POA: Insufficient documentation

## 2019-08-25 DIAGNOSIS — Z20828 Contact with and (suspected) exposure to other viral communicable diseases: Secondary | ICD-10-CM | POA: Diagnosis present

## 2019-08-25 DIAGNOSIS — Z4659 Encounter for fitting and adjustment of other gastrointestinal appliance and device: Secondary | ICD-10-CM

## 2019-08-25 DIAGNOSIS — K56609 Unspecified intestinal obstruction, unspecified as to partial versus complete obstruction: Secondary | ICD-10-CM

## 2019-08-25 DIAGNOSIS — R112 Nausea with vomiting, unspecified: Secondary | ICD-10-CM | POA: Diagnosis present

## 2019-08-25 LAB — CBC WITH DIFFERENTIAL/PLATELET
Abs Immature Granulocytes: 0.52 10*3/uL — ABNORMAL HIGH (ref 0.00–0.07)
Basophils Absolute: 0.1 10*3/uL (ref 0.0–0.1)
Basophils Relative: 1 %
Eosinophils Absolute: 0.1 10*3/uL (ref 0.0–0.5)
Eosinophils Relative: 1 %
HCT: 36 % (ref 36.0–46.0)
Hemoglobin: 11.9 g/dL — ABNORMAL LOW (ref 12.0–15.0)
Immature Granulocytes: 4 %
Lymphocytes Relative: 11 %
Lymphs Abs: 1.5 10*3/uL (ref 0.7–4.0)
MCH: 29 pg (ref 26.0–34.0)
MCHC: 33.1 g/dL (ref 30.0–36.0)
MCV: 87.8 fL (ref 80.0–100.0)
Monocytes Absolute: 0.9 10*3/uL (ref 0.1–1.0)
Monocytes Relative: 7 %
Neutro Abs: 10.1 10*3/uL — ABNORMAL HIGH (ref 1.7–7.7)
Neutrophils Relative %: 76 %
Platelets: 385 10*3/uL (ref 150–400)
RBC: 4.1 MIL/uL (ref 3.87–5.11)
RDW: 14.3 % (ref 11.5–15.5)
WBC: 13.2 10*3/uL — ABNORMAL HIGH (ref 4.0–10.5)
nRBC: 0 % (ref 0.0–0.2)

## 2019-08-25 LAB — COMPREHENSIVE METABOLIC PANEL
ALT: 28 U/L (ref 0–44)
AST: 23 U/L (ref 15–41)
Albumin: 3 g/dL — ABNORMAL LOW (ref 3.5–5.0)
Alkaline Phosphatase: 66 U/L (ref 38–126)
Anion gap: 11 (ref 5–15)
BUN: 17 mg/dL (ref 8–23)
CO2: 29 mmol/L (ref 22–32)
Calcium: 9.7 mg/dL (ref 8.9–10.3)
Chloride: 102 mmol/L (ref 98–111)
Creatinine, Ser: 0.81 mg/dL (ref 0.44–1.00)
GFR calc Af Amer: >60 mL/min (ref >60)
GFR calc non Af Amer: >60 mL/min (ref >60)
Glucose, Bld: 128 mg/dL — ABNORMAL HIGH (ref 70–99)
Potassium: 3.8 mmol/L (ref 3.5–5.1)
Sodium: 142 mmol/L (ref 135–145)
Total Bilirubin: 0.7 mg/dL (ref 0.3–1.2)
Total Protein: 7 g/dL (ref 6.5–8.1)

## 2019-08-25 MED ORDER — MORPHINE SULFATE (PF) 2 MG/ML IV SOLN: 2.0000 mg | INTRAVENOUS | Status: DC | PRN

## 2019-08-25 MED ORDER — KCL IN DEXTROSE-NACL 20-5-0.45 MEQ/L-%-% IV SOLN
INTRAVENOUS | Status: DC
  Administered 2019-08-25 – 2019-08-28 (×7): via INTRAVENOUS
  Filled 2019-08-25 (×14): qty 1000

## 2019-08-25 MED ORDER — PROMETHAZINE HCL 25 MG/ML IJ SOLN: 6.2500 mg | INTRAMUSCULAR | Status: DC | PRN

## 2019-08-25 MED ORDER — ONDANSETRON HCL 4 MG/2ML IJ SOLN
4.0000 mg | INTRAMUSCULAR | Status: DC | PRN
  Administered 2019-08-25: 4 mg via INTRAVENOUS
  Filled 2019-08-25: qty 2

## 2019-08-25 MED ORDER — ALUM & MAG HYDROXIDE-SIMETH 200-200-20 MG/5ML PO SUSP: 30.0000 mL | ORAL | Status: DC | PRN

## 2019-08-25 MED ORDER — KCL IN DEXTROSE-NACL 20-5-0.45 MEQ/L-%-% IV SOLN: INTRAVENOUS | Status: DC

## 2019-08-25 MED ORDER — ACETAMINOPHEN 650 MG RE SUPP: 650.0000 mg | Freq: Four times a day (QID) | RECTAL | Status: DC | PRN

## 2019-08-25 MED ORDER — ACETAMINOPHEN 325 MG PO TABS: 650.0000 mg | ORAL_TABLET | Freq: Four times a day (QID) | ORAL | Status: DC | PRN

## 2019-08-25 MED ORDER — ENOXAPARIN SODIUM 40 MG/0.4ML ~~LOC~~ SOLN: 40.0000 mg | SUBCUTANEOUS | Status: DC

## 2019-08-25 MED ORDER — BUTAMBEN-TETRACAINE-BENZOCAINE 2-2-14 % EX AERO
1.0000 | INHALATION_SPRAY | Freq: Three times a day (TID) | CUTANEOUS | Status: DC
  Administered 2019-08-25: 1 via TOPICAL
  Filled 2019-08-25: qty 5

## 2019-08-25 MED ORDER — PROMETHAZINE HCL 25 MG/ML IJ SOLN
6.2500 mg | INTRAMUSCULAR | Status: DC | PRN
  Administered 2019-08-25: 16:00:00 6.25 mg via INTRAVENOUS
  Filled 2019-08-25: qty 1

## 2019-08-25 MED ORDER — PANTOPRAZOLE SODIUM 40 MG IV SOLR: 40.0000 mg | INTRAVENOUS | Status: DC

## 2019-08-25 MED ORDER — ENOXAPARIN SODIUM 40 MG/0.4ML ~~LOC~~ SOLN
40.0000 mg | SUBCUTANEOUS | Status: DC
  Administered 2019-08-25 – 2019-08-28 (×4): 40 mg via SUBCUTANEOUS
  Filled 2019-08-25 (×4): qty 0.4

## 2019-08-25 MED ORDER — PANTOPRAZOLE SODIUM 40 MG PO TBEC
40.0000 mg | DELAYED_RELEASE_TABLET | Freq: Every day | ORAL | Status: DC
  Administered 2019-08-26 – 2019-08-29 (×4): 40 mg via ORAL
  Filled 2019-08-25 (×4): qty 1

## 2019-08-25 NOTE — H&P (Signed)
78 y.o woman recently discharged after emergency sigmoid colectomy and stoma/ Hartman procedure.  Did well until last PM when she developed pain and terrible heartburn.  Multiple episodes of vomiting overnight.  Exam at her home this AM did not show any acute abdominal process. Brought to hospital for hydration and radiologic assessment.   No new meds.  Exam: Lungs: Clear. Cardio: RR. ABD: Minimal distension, tenderness  Consistent with post op status. Stoma functioning (still dusky). Wound: Clean.  Plain films:  Marked gastric distension, otherwise gasless abdomen. No free air. Labs: CBC and HGB up consistent with pain/ dehydration.  Electrolyets pending.   IMP: Gastric ileus.   Plan: NG. IV fluids. Repeat CEA in AM post resection.

## 2019-08-26 LAB — CBC WITH DIFFERENTIAL/PLATELET
Abs Immature Granulocytes: 0.28 10*3/uL — ABNORMAL HIGH (ref 0.00–0.07)
Basophils Absolute: 0.1 10*3/uL (ref 0.0–0.1)
Basophils Relative: 1 %
Eosinophils Absolute: 0.2 10*3/uL (ref 0.0–0.5)
Eosinophils Relative: 2 %
HCT: 31.5 % — ABNORMAL LOW (ref 36.0–46.0)
Hemoglobin: 10.3 g/dL — ABNORMAL LOW (ref 12.0–15.0)
Immature Granulocytes: 3 %
Lymphocytes Relative: 20 %
Lymphs Abs: 2.3 10*3/uL (ref 0.7–4.0)
MCH: 29 pg (ref 26.0–34.0)
MCHC: 32.7 g/dL (ref 30.0–36.0)
MCV: 88.7 fL (ref 80.0–100.0)
Monocytes Absolute: 0.9 10*3/uL (ref 0.1–1.0)
Monocytes Relative: 8 %
Neutro Abs: 7.6 10*3/uL (ref 1.7–7.7)
Neutrophils Relative %: 66 %
Platelets: 344 10*3/uL (ref 150–400)
RBC: 3.55 MIL/uL — ABNORMAL LOW (ref 3.87–5.11)
RDW: 14.5 % (ref 11.5–15.5)
WBC: 11.3 10*3/uL — ABNORMAL HIGH (ref 4.0–10.5)
nRBC: 0 % (ref 0.0–0.2)

## 2019-08-26 LAB — SARS CORONAVIRUS 2 (TAT 6-24 HRS): SARS Coronavirus 2: NEGATIVE

## 2019-08-26 MED ORDER — PHENOL 1.4 % MT LIQD
1.0000 | OROMUCOSAL | Status: DC | PRN
  Administered 2019-08-26: 05:00:00 1 via OROMUCOSAL
  Filled 2019-08-26: qty 177

## 2019-08-26 NOTE — Progress Notes (Signed)
Tmax 100, VSS. U/O low,should come up. Marked relief of acid reflux and upper abdominal symptoms.with NG placement. No SOB/CP. Stoma functioning. Lungs: Clear. Much better aeration in the lower lobers. Cardio: RR. ABD: Soft, near scaphoid.  Extrem: Soft. Labs: HGB back to baseline. WBC down to 11K. IMP: Gastric ileus. Plan: Ambulation.  Continue NG.

## 2019-08-27 ENCOUNTER — Inpatient Hospital Stay: Payer: Medicare Other

## 2019-08-27 LAB — CBC WITH DIFFERENTIAL/PLATELET
Abs Immature Granulocytes: 0.24 10*3/uL — ABNORMAL HIGH (ref 0.00–0.07)
Basophils Absolute: 0.1 10*3/uL (ref 0.0–0.1)
Basophils Relative: 1 %
Eosinophils Absolute: 0.3 10*3/uL (ref 0.0–0.5)
Eosinophils Relative: 3 %
HCT: 33.2 % — ABNORMAL LOW (ref 36.0–46.0)
Hemoglobin: 10.9 g/dL — ABNORMAL LOW (ref 12.0–15.0)
Immature Granulocytes: 3 %
Lymphocytes Relative: 20 %
Lymphs Abs: 1.8 10*3/uL (ref 0.7–4.0)
MCH: 29.1 pg (ref 26.0–34.0)
MCHC: 32.8 g/dL (ref 30.0–36.0)
MCV: 88.8 fL (ref 80.0–100.0)
Monocytes Absolute: 0.8 10*3/uL (ref 0.1–1.0)
Monocytes Relative: 9 %
Neutro Abs: 5.8 10*3/uL (ref 1.7–7.7)
Neutrophils Relative %: 64 %
Platelets: 337 10*3/uL (ref 150–400)
RBC: 3.74 MIL/uL — ABNORMAL LOW (ref 3.87–5.11)
RDW: 14 % (ref 11.5–15.5)
WBC: 8.9 10*3/uL (ref 4.0–10.5)
nRBC: 0 % (ref 0.0–0.2)

## 2019-08-27 LAB — BASIC METABOLIC PANEL
Anion gap: 10 (ref 5–15)
BUN: 12 mg/dL (ref 8–23)
CO2: 23 mmol/L (ref 22–32)
Calcium: 8.6 mg/dL — ABNORMAL LOW (ref 8.9–10.3)
Chloride: 107 mmol/L (ref 98–111)
Creatinine, Ser: 0.73 mg/dL (ref 0.44–1.00)
GFR calc Af Amer: >60 mL/min (ref >60)
GFR calc non Af Amer: >60 mL/min (ref >60)
Glucose, Bld: 105 mg/dL — ABNORMAL HIGH (ref 70–99)
Potassium: 3.6 mmol/L (ref 3.5–5.1)
Sodium: 140 mmol/L (ref 135–145)

## 2019-08-27 LAB — SURGICAL PATHOLOGY

## 2019-08-27 LAB — CEA: CEA: 5.8 ng/mL — ABNORMAL HIGH (ref 0.0–4.7)

## 2019-08-27 NOTE — Progress Notes (Signed)
Staples removed without complications and steristrips/benzoine applied per MD order.  Patient tolerated well.

## 2019-08-27 NOTE — Progress Notes (Signed)
AVSS. Pain free.  Stoma functioning.  Urine output up.  NG: 900 cc bilious material overnight.  No nausea. Ambulating well. Inspirex at 1250. Lungs: Clear. Cardio: RR. ABD: Flat,soft, non-tender. Wound: Clean and dry.  Extrem: Soft. IMP: Resolving ileus.

## 2019-08-28 ENCOUNTER — Inpatient Hospital Stay: Payer: Medicare Other

## 2019-08-28 MED ORDER — METOCLOPRAMIDE HCL 5 MG PO TABS
5.0000 mg | ORAL_TABLET | Freq: Three times a day (TID) | ORAL | Status: DC
  Administered 2019-08-28 – 2019-08-29 (×5): 5 mg via ORAL
  Filled 2019-08-28 (×5): qty 1

## 2019-08-28 NOTE — Progress Notes (Signed)
Per MD okay for RN to DC NG Tube.

## 2019-08-28 NOTE — TOC Initial Note (Addendum)
Transition of Care [facility]) - Initial/Assessment Note    Patient Details  Name: Courtney Jefferson MRN: [id number] Date of Birth: Mar 03, 1941  Transition of Care [facility]) CM/SW Contact:    Beverly Sessions, RN Phone Number: 08/28/2019, 3:26 PM  Clinical B  Baseline patient lives at home alone, drives, and is independent of ADLS.   Previous discharge patient discharged to her daughter Courtney Jefferson home  PCP [place] - denies issues obtaining medications  Patient open with [place] for nursing.  [name] with [place] notified of admission  Narrative:                   Expected Discharge Plan: [place] Barriers to Discharge: Continued Medical Work up   Patient Goals and CMS Choice        Expected Discharge Plan and Services Expected Discharge Plan: [place]   Discharge Planning Services: CM Consult   Living arrangements for the past 2 months: Single Family Home                           HH Arranged: RN [facility] Agency: [place] ([place]) Date HH Agency Contacted: 08/27/19   Representative spoke with at [place]: [name]  Prior Living Arrangements/Services Living arrangements for the past 2 months: [place] Lives with:: Adult Children Patient language and need for interpreter reviewed:: Yes Do you feel safe going back to the place where you live?: Yes      Need for Family Participation in Patient Care: Yes (Comment) Care giver support system in place?: Yes (comment)   Criminal Activity/Legal Involvement Pertinent to Current Situation/Hospitalization: No - Comment as needed  Activities of Daily Living Home Assistive Devices/Equipment: None ADL Screening (condition at time of admission) Patient's cognitive ability adequate to safely complete daily activities?: Yes Is the patient deaf or have difficulty hearing?: No Does the patient have difficulty seeing, even  when wearing glasses/contacts?: No Does the patient have difficulty concentrating, remembering, or making decisions?: No Patient able to express need for assistance with ADLs?: Yes Does the patient have difficulty dressing or bathing?: No Independently performs ADLs?: Yes (appropriate for developmental age) Does the patient have difficulty walking or climbing stairs?: No Weakness of Legs: None Weakness of Arms/Hands: None  Permission Sought/Granted                  Emotional Assessment Appearance:: Appears stated age Attitude/Demeanor/Rapport: Gracious   Orientation: : Oriented to Self, Oriented to Place, Oriented to  Time, Oriented to Situation   Psych Involvement: No (comment)  Admission diagnosis:  post op complications Patient Active Problem List   Diagnosis Date Noted  . Diverticulitis large intestine w/o perforation or abscess w/o bleeding 08/25/2019  . Nausea with vomiting 08/25/2019  . Post-operative nausea and vomiting 08/25/2019  . Colonic obstruction ([place]) 08/17/2019  . Hypertension 05/03/2017  . Splenic artery aneurysm ([place]) 05/03/2017   PCP:  Dion Body, MD [facility]   [id number] [place] [place] - [address] [place] [address] Phone: [phone] Fax: [phone]  [id number] [place] [address] [place] [address] Phone: [phone] Fax: [phone]     Social Determinants of Health (SDOH) Interventions    Readmission Risk Interventions No flowsheet data found.

## 2019-08-28 NOTE — Progress Notes (Signed)
AVSS. Quiet night. Ambulating frequently. U/O: OK. Stoma functioning. Lungs: Clear. Cardio: RR. ABD: Soft. BS+.  Wound: Clean Extrem: Soft. Plan: SBFT today.

## 2019-08-28 NOTE — Progress Notes (Signed)
SBFT films reviewed.  Mild gastric atony, but no small bowel obstruction.  Patient feeling well w/ NG out. Reglan started.  Plan: Liquids today, home in AM if all well.

## 2019-08-28 NOTE — Care Management Important Message (Signed)
Important Message  Patient Details  Name: Courtney Jefferson MRN: [id number] Date of Birth: 07/06/1941   Medicare Important Message Given:  Yes     Dannette Barbara 08/28/2019, 11:31 AM

## 2019-08-29 ENCOUNTER — Inpatient Hospital Stay: Payer: Medicare Other | Admitting: Internal Medicine

## 2019-08-29 ENCOUNTER — Other Ambulatory Visit: Payer: Self-pay | Admitting: General Surgery

## 2019-08-29 DIAGNOSIS — K56609 Unspecified intestinal obstruction, unspecified as to partial versus complete obstruction: Secondary | ICD-10-CM

## 2019-08-29 MED ORDER — ENOXAPARIN SODIUM 40 MG/0.4ML ~~LOC~~ SOLN: 40.0000 mg | SUBCUTANEOUS | 0 refills | Status: DC

## 2019-08-29 MED ORDER — METOCLOPRAMIDE HCL 5 MG PO TABS: 5.0000 mg | ORAL_TABLET | Freq: Three times a day (TID) | ORAL | 0 refills | Status: DC

## 2019-08-29 NOTE — Progress Notes (Signed)
Courtney Jefferson to be D/C'd Home per MD order.  Discussed prescriptions and follow up appointments with the patient. Prescriptions given to patient, medication list explained in detail. Pt verbalized understanding.  Allergies as of 08/29/2019   No Known Allergies     Medication List    STOP taking these medications   BLACK COHOSH PO   Calcium Carbonate-Vitamin D 600-400 MG-UNIT tablet     TAKE these medications   acetaminophen 325 MG tablet Commonly known as: TYLENOL Take 2 tablets (650 mg total) by mouth every 6 (six) hours as needed.   enoxaparin 40 MG/0.4ML injection Commonly known as: LOVENOX Inject 0.4 mLs (40 mg total) into the skin daily for 12 days.   metoCLOPramide 5 MG tablet Commonly known as: REGLAN Take 1 tablet (5 mg total) by mouth 4 (four) times daily -  before meals and at bedtime.       Vitals:   08/27/19 0043 08/28/19 1451  BP: 127/70   Pulse: 84   Resp: 20 18  Temp: 98.6 F (37 C)     Skin clean, dry and intact without evidence of skin break down, no evidence of skin tears noted. IV catheter discontinued intact. Site without signs and symptoms of complications. Dressing and pressure applied. Pt denies pain at this time. No complaints noted.  An After Visit Summary was printed and given to the patient. Patient escorted via [place], and D/C home via private auto.  Marry Guan

## 2019-08-29 NOTE — TOC Transition Note (Signed)
Transition of Care [facility]) - CM/SW Discharge Note   Patient Details  Name: Courtney Jefferson MRN: [id number] Date of Birth: August 12, 1941  Transition [facility]) CM/SW Contact:  Beverly Sessions, RN Phone Number: 08/29/2019, 4:51 PM   Clinical Narrative:    Patient discharge back home today to daughter's home [place] with [place] notified of discharge order.    Requested for MD to place resumption orders for home health RN   Final next level of care: [place] Barriers to Discharge: No Barriers Identified   Patient Goals and CMS Choice        Discharge Placement                       Discharge Plan and Services   Discharge Planning Services: CM Consult                      HH Arranged: RN [facility] Agency: [place] ([place]) Date [place]: 08/29/19 Time [place]: [place] Representative spoke with at [place]: [place] ([place]) Interventions     Readmission Risk Interventions No flowsheet data found.

## 2019-08-29 NOTE — Final Progress Note (Signed)
AVSS. Tolerated advance to full liquids well. Stoma functioning well, patient comfortable with care. Lungs: Clear. Cardio: RR. ABD: Soft.   Extrem: Soft. Plan: Home today.

## 2019-09-01 NOTE — Discharge Summary (Signed)
Physician Discharge Summary  Patient ID: Courtney Jefferson MRN: [id number] DOB/AGE: [birth year] 78 y.o.  Admit date: 08/17/2019 Discharge date: 09/01/2019  Admission Diagnoses:  Obstructing carcinoma of the left colon.    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 Taken to the operating room the morning after admission at which time a left colectomy, colostomy and [place] procedure was completed.  Quick return of GI function. Ambulated well.  No cardiopulmomary events.   Consults: None  Significant Diagnostic Studies: pathology: pT4, N2, M1. 5/ 28 nodes positive.  MMR testing negative.   Treatments: surgery:  Left colectomy.    Discharge Exam: Blood pressure 138/69, pulse 89, temperature 98.4 F (36.9 C), temperature source Oral, resp. rate 20, height _0  (1.6 m), weight 65.8 kg, SpO2 97 %. General appearance: alert and cooperative Resp: clear to auscultation bilaterally Cardio: regular rate and rhythm, S1, S2 normal, no murmur, click, rub or gallop GI: soft, non-tender; bowel sounds normal; no masses,  no organomegaly Incision/Wound:Stoma dusky but functioning well.   Disposition:   Discharge Instructions    Diet - low sodium heart healthy   Complete by: As directed    Discharge instructions   Complete by: As directed    OK to shower.  Walk frequently at home.  Use incentive spirometer frequently during the day.  Tylenol: If needed for soreness.  Diet as tolerated.  Change the colostomy plate when leaking.  Change the bag when needed.   Increase activity slowly   Complete by: As directed      Allergies as of 08/22/2019   No Known Allergies     Medication List    STOP taking these medications   triamterene-hydrochlorothiazide 37.5-25 MG tablet Commonly known as: MAXZIDE-25     TAKE these  medications   acetaminophen 325 MG tablet Commonly known as: TYLENOL Take 2 tablets (650 mg total) by mouth every 6 (six) hours as needed.      Follow-up Information    Salim Forero, Forest Gleason, MD. Go on 08/28/2019.   Specialties: General Surgery, Radiology Why: Go at 10:30am. Contact information: [address] [place] [address] [phone]        Cammie Sickle, MD In 1 week.   Specialties: Internal Medicine, Oncology Why: Please call to make your follow up. Contact information: [place] [address] [phone]           Signed: Robert Bellow 09/01/2019, 7:15 PM

## 2019-09-02 NOTE — Discharge Summary (Signed)
Physician Discharge Summary  Patient ID: Courtney Jefferson MRN: [id number] DOB/AGE: Nov 13, 1940 78 y.o.  Admit date: 08/25/2019 Discharge date: 09/02/2019  Admission Diagnoses: Post operative nausea and vomiting.    Discharge Diagnoses:  Active Problems:   Nausea with vomiting   Post-operative nausea and vomiting Gastric atony  Discharged Condition: good  Hospital Course: NG placed with resolution of retrosternal chest pain/ heartburn.  SBFT showed slow gastric emptying with normal small bowel transit to the stoma.  Tolerated diet on post admission day three without incident.   Consults: None  Significant Diagnostic Studies: SBFT  Treatments: IV hydration and reglan  Discharge Exam: Blood pressure 127/70, pulse 84, temperature 98.6 F (37 C), temperature source Oral, resp. rate 18, height 5\' 3"  (1.6 m), weight 62.9 kg. General appearance: alert and cooperative Resp: clear to auscultation bilaterally Cardio: regular rate and rhythm, S1, S2 normal, no murmur, click, rub or gallop GI: soft, non-tender; bowel sounds normal; no masses,  no organomegaly Incision/Wound: Midline wound healing well.   Disposition:   Discharge Instructions    Discharge instructions   Complete by: As directed    Soft diet.  Avoid hard to digest foods.  Continue Lovenox at home until office follow up.  Tylenol if needed for soreness.  Avoid lifting over 10 pounds.   Increase activity slowly   Complete by: As directed      Allergies as of 08/29/2019   No Known Allergies     Medication List    STOP taking these medications   BLACK COHOSH PO   Calcium Carbonate-Vitamin D 600-400 MG-UNIT tablet     TAKE these medications   acetaminophen 325 MG tablet Commonly known as: TYLENOL Take 2 tablets (650 mg total) by mouth every 6 (six) hours as needed.   enoxaparin 40 MG/0.4ML injection Commonly known as: LOVENOX Inject 0.4 mLs (40 mg total) into the skin daily for 12 days.    metoCLOPramide 5 MG tablet Commonly known as: REGLAN Take 1 tablet (5 mg total) by mouth 4 (four) times daily -  before meals and at bedtime.      Follow-up Information    Robert Bellow, MD.   Specialties: General Surgery, Radiology Why: Please call Monday November 30 for a follow up appointment on Tuesday or Thursday.  Contact information: [address] [place] [address] [phone]           Signed: Robert Bellow 09/02/2019, 12:13 PM

## 2019-09-06 ENCOUNTER — Other Ambulatory Visit: Payer: Medicare Other

## 2019-09-06 NOTE — Progress Notes (Signed)
Tumor Board Documentation  Courtney Jefferson was presented by Dr Rogue Bussing at our Tumor Board on 09/06/2019, which included representatives from medical oncology, radiation oncology, navigation, pathology, radiology, surgical, internal medicine, genetics, pulmonology, palliative care, research.  Courtney Jefferson currently presents as a new patient, for [place], for new positive pathology with history of the following treatments: active survellience, surgical intervention(s).  Additionally, we reviewed previous medical and familial history, history of present illness, and recent lab results along with all available histopathologic and imaging studies. The tumor board considered available treatment options and made the following recommendations: Additional screening, Adjuvant chemotherapy MRI liver, PET scan  The following procedures/referrals were also placed: No orders of the defined types were placed in this encounter.   Clinical Trial Status: not discussed   Staging used: Pathologic Stage  AJCC Staging: T: 4 N: 2 M: x Group: Stage 3 Adenocarcinoma of colon   National site-specific guidelines NCCN were discussed with respect to the case.  Tumor board is a meeting of clinicians from various specialty areas who evaluate and discuss patients for whom a multidisciplinary approach is being considered. Final determinations in the plan of care are those of the provider(s). The responsibility for follow up of recommendations given during tumor board is that of the provider.   Today's extended care, comprehensive team conference, Courtney Jefferson was not present for the discussion and was not examined.   Multidisciplinary Tumor Board is a multidisciplinary case peer review process.  Decisions discussed in the Multidisciplinary Tumor Board reflect the opinions of the specialists present at the conference without having examined the patient.  Ultimately, treatment and diagnostic decisions rest with the primary provider(s)  and the patient.

## 2019-09-12 ENCOUNTER — Other Ambulatory Visit: Payer: Self-pay

## 2019-09-13 ENCOUNTER — Other Ambulatory Visit: Payer: Self-pay

## 2019-09-13 ENCOUNTER — Inpatient Hospital Stay: Payer: Medicare Other

## 2019-09-13 ENCOUNTER — Encounter: Payer: Self-pay | Admitting: Internal Medicine

## 2019-09-13 ENCOUNTER — Inpatient Hospital Stay: Payer: Medicare Other | Attending: Internal Medicine | Admitting: Internal Medicine

## 2019-09-13 ENCOUNTER — Telehealth: Payer: Self-pay | Admitting: Internal Medicine

## 2019-09-13 DIAGNOSIS — Z7189 Other specified counseling: Secondary | ICD-10-CM | POA: Insufficient documentation

## 2019-09-13 DIAGNOSIS — C186 Malignant neoplasm of descending colon: Secondary | ICD-10-CM

## 2019-09-13 DIAGNOSIS — Z809 Family history of malignant neoplasm, unspecified: Secondary | ICD-10-CM

## 2019-09-13 DIAGNOSIS — R911 Solitary pulmonary nodule: Secondary | ICD-10-CM | POA: Diagnosis not present

## 2019-09-13 DIAGNOSIS — Z87891 Personal history of nicotine dependence: Secondary | ICD-10-CM | POA: Diagnosis not present

## 2019-09-13 DIAGNOSIS — K769 Liver disease, unspecified: Secondary | ICD-10-CM

## 2019-09-13 DIAGNOSIS — Z933 Colostomy status: Secondary | ICD-10-CM

## 2019-09-13 LAB — COMPREHENSIVE METABOLIC PANEL
ALT: 17 U/L (ref 0–44)
AST: 14 U/L — ABNORMAL LOW (ref 15–41)
Albumin: 3.6 g/dL (ref 3.5–5.0)
Alkaline Phosphatase: 49 U/L (ref 38–126)
Anion gap: 8 (ref 5–15)
BUN: 13 mg/dL (ref 8–23)
CO2: 24 mmol/L (ref 22–32)
Calcium: 9.3 mg/dL (ref 8.9–10.3)
Chloride: 107 mmol/L (ref 98–111)
Creatinine, Ser: 0.69 mg/dL (ref 0.44–1.00)
GFR calc Af Amer: >60 mL/min (ref >60)
GFR calc non Af Amer: >60 mL/min (ref >60)
Glucose, Bld: 93 mg/dL (ref 70–99)
Potassium: 3.8 mmol/L (ref 3.5–5.1)
Sodium: 139 mmol/L (ref 135–145)
Total Bilirubin: 0.4 mg/dL (ref 0.3–1.2)
Total Protein: 7.1 g/dL (ref 6.5–8.1)

## 2019-09-13 LAB — CBC WITH DIFFERENTIAL/PLATELET
Abs Immature Granulocytes: 0.02 10*3/uL (ref 0.00–0.07)
Basophils Absolute: 0.1 10*3/uL (ref 0.0–0.1)
Basophils Relative: 1 %
Eosinophils Absolute: 0.2 10*3/uL (ref 0.0–0.5)
Eosinophils Relative: 2 %
HCT: 36.8 % (ref 36.0–46.0)
Hemoglobin: 11.4 g/dL — ABNORMAL LOW (ref 12.0–15.0)
Immature Granulocytes: 0 %
Lymphocytes Relative: 27 %
Lymphs Abs: 1.8 10*3/uL (ref 0.7–4.0)
MCH: 29.3 pg (ref 26.0–34.0)
MCHC: 31 g/dL (ref 30.0–36.0)
MCV: 94.6 fL (ref 80.0–100.0)
Monocytes Absolute: 0.5 10*3/uL (ref 0.1–1.0)
Monocytes Relative: 8 %
Neutro Abs: 4 10*3/uL (ref 1.7–7.7)
Neutrophils Relative %: 62 %
Platelets: 260 10*3/uL (ref 150–400)
RBC: 3.89 MIL/uL (ref 3.87–5.11)
RDW: 14.2 % (ref 11.5–15.5)
WBC: 6.5 10*3/uL (ref 4.0–10.5)
nRBC: 0 % (ref 0.0–0.2)

## 2019-09-13 NOTE — Telephone Encounter (Signed)
[place]- please order Foundation One on the tumor sample.

## 2019-09-13 NOTE — Assessment & Plan Note (Addendum)
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  Left lobe of liver peripheral-1.2 cm lesion concerning for metastasis; 4 mm lung nodule. Recommend PET for further evaluation.   #I reviewed the pathology and staging with the patient and daughter in detail.  Reviewed imaging independently summarized above. Discussed the prognosis-depends upon stage of disease; treatment response/tolerance to therapy.    # Discussed that goal of treatment would be cure-if stage III cancer.  If stage IV cancer-in general incurable; treatments are palliative.  However patient did not improve the resection-5-year survival would be around 30%. Discussed regarding possibility of liver resection-the higher center.  # I discussed that FOLFOX chemotherapy is given every 2 weeks.  Avastin potentially could be added to enhance response rates.  The number of cycles/duration of treatment difficult to define at this time. My preference would be to proceed with "neo-adjuvant chemo".   # Discussed with Dr.Byrnett; re: above plan; and also re: port placement.   Thank you Dr.Byrnett for allowing me to participate in the care of your pleasant patient. Please do not hesitate to contact me with questions or concerns in the interim.  # DISPOSITION: # PET SCAN asap # chemo ed next week # labs today- cbc/cmp/cea # follow up in 1-2 days after PET scan -Dr.B

## 2019-09-13 NOTE — Progress Notes (Signed)
Colon CONSULT NOTE  Patient Care Team: Dion Body, MD as PCP - General (Family Medicine) Clent Jacks, RN as Oncology Nurse Navigator  CHIEF COMPLAINTS/PURPOSE OF CONSULTATION: Colon cancer  #  Oncology History Overview Note  # NOV 2020 - SIGMOID COLON CANCER; pT4pN2 [5/26LN]? M1 [liver lesion; solitary]; left hemicolectomy; Dr.Byrnett; invasion of visceral peritoneum; G-2; NEG-margins;  NOV 13/2020-Liver lesion 1.2cm / left hepatic; 4 mm left lower lobe lung nodule; distal descending colon mass; enlarged lymph nodes obstruction present  #   # NGS/MOLECULAR TESTS: P; MSI-STABLE.   # PALLIATIVE CARE EVALUATION: P  # PAIN MANAGEMENT: none   DIAGNOSIS: COLON CA  STAGE:   IV      ;  GOALS: control  CURRENT/MOST RECENT THERAPY : FOLFOX    Cancer of left colon ([place])  09/13/2019 Initial Diagnosis   Cancer of left colon ([facility])      HISTORY OF PRESENTING ILLNESS:  Courtney Jefferson 78 y.o.  female with no significant past medical history-is referred to [place] for further evaluation recommendations for colon cancer.  Patient noted to have obstructive symptoms of worsening abdominal pain  constipation nausea vomiting prior to presentation to hospital in late November.  CT scan done in the emergency room showed-distal colonic mass/abdominal adenopathy; also 1.2 cm left lower lobe hypoenhancing lesion concerning for metastasis.  Patient was subsequently taken to surgery-hemicolectomy with colostomy.  Postoperative course was complicated by an episode of ileus which was treated conservatively.  Patient currently denies any abdominal pain nausea vomiting.Patient did not have a prior colonoscopy.   Review of Systems  Constitutional: Positive for malaise/fatigue and weight loss. Negative for chills, diaphoresis and fever.  HENT: Negative for nosebleeds and sore throat.   Eyes: Negative for double vision.  Respiratory: Negative for cough, hemoptysis, sputum  production, shortness of breath and wheezing.   Cardiovascular: Negative for chest pain, palpitations, orthopnea and leg swelling.  Gastrointestinal: Negative for blood in stool, diarrhea, heartburn, melena, nausea and vomiting.  Genitourinary: Negative for dysuria, frequency and urgency.  Musculoskeletal: Negative for back pain and joint pain.  Skin: Negative.  Negative for itching and rash.  Neurological: Negative for dizziness, tingling, focal weakness, weakness and headaches.  Endo/Heme/Allergies: Does not bruise/bleed easily.  Psychiatric/Behavioral: Negative for depression. The patient is not nervous/anxious and does not have insomnia.      MEDICAL HISTORY:  Past Medical History:  Diagnosis Date  . Hypertension     SURGICAL HISTORY: Past Surgical History:  Procedure Laterality Date  . ABDOMINAL HYSTERECTOMY     partial  . COLECTOMY WITH COLOSTOMY CREATION/HARTMANN PROCEDURE  08/18/2019   Procedure: COLECTOMY WITH COLOSTOMY CREATION/HARTMANN PROCEDURE;  Surgeon: Robert Bellow, MD;  Location: [facility] ORS;  Service: General;;  . LAPAROTOMY N/A 08/18/2019   Procedure: Exploratory Laparotomy;  Surgeon: Robert Bellow, MD;  Location: [facility] ORS;  Service: General;  Laterality: N/A;    SOCIAL HISTORY: Social History   Socioeconomic History  . Marital status: Widowed    Spouse name: Not on file  . Number of children: Not on file  . Years of education: Not on file  . Highest education level: Not on file  Occupational History  . Not on file  Tobacco Use  . Smoking status: Former Research scientist (life sciences)  . Smokeless tobacco: Never Used  Substance and Sexual Activity  . Alcohol use: No  . Drug use: No  . Sexual activity: Not Currently    Birth control/protection: Surgical  Other Topics Concern  .  Not on file  Social History Narrative   Lives in [place]; no smoking; no alcohol; [employer]. Lives by self; daughters live close by.    Social Determinants of Health   Financial  Resource Strain:   . Difficulty of Paying Living Expenses: Not on file  Food Insecurity:   . Worried About Charity fundraiser in the Last Year: Not on file  . Ran Out of Food in the Last Year: Not on file  Transportation Needs:   . Lack of Transportation (Medical): Not on file  . Lack of Transportation (Non-Medical): Not on file  Physical Activity:   . Days of Exercise per Week: Not on file  . Minutes of Exercise per Session: Not on file  Stress:   . Feeling of Stress : Not on file  Social Connections:   . Frequency of Communication with Friends and Family: Not on file  . Frequency of Social Gatherings with Friends and Family: Not on file  . Attends Religious Services: Not on file  . Active Member of Clubs or Organizations: Not on file  . Attends Archivist Meetings: Not on file  . Marital Status: Not on file  Intimate Partner Violence:   . Fear of Current or Ex-Partner: Not on file  . Emotionally Abused: Not on file  . Physically Abused: Not on file  . Sexually Abused: Not on file    FAMILY HISTORY: Family History  Problem Relation Age of Onset  . Diabetes Sister   . Cancer Brother        died young    ALLERGIES:  has No Known Allergies.  MEDICATIONS:  Current Outpatient Medications  Medication Sig Dispense Refill  . acetaminophen (TYLENOL) 325 MG tablet Take 2 tablets (650 mg total) by mouth every 6 (six) hours as needed. 60 tablet 0  . Black Cohosh 40 MG CAPS Take 1 capsule by mouth.    . calcium carbonate (OSCAL) 1500 (600 Ca) MG TABS tablet Take by mouth 2 (two) times daily with a meal.    . VITAMIN D, CHOLECALCIFEROL, PO Take 1 capsule by mouth.    . enoxaparin (LOVENOX) 40 MG/0.4ML injection Inject 0.4 mLs (40 mg total) into the skin daily for 12 days. 0.4 mL 0  . metoCLOPramide (REGLAN) 5 MG tablet Take 1 tablet (5 mg total) by mouth 4 (four) times daily -  before meals and at bedtime. (Patient not taking: Reported on 09/13/2019) 30 tablet 0   No  current facility-administered medications for this visit.      Marland Kitchen  PHYSICAL EXAMINATION: ECOG PERFORMANCE STATUS: 1 - Symptomatic but completely ambulatory  Vitals:   09/13/19 1026  BP: (!) 170/83  Pulse: 75  Temp: 98.7 F (37.1 C)   Filed Weights   09/13/19 1026  Weight: 140 lb 9.6 oz (63.8 kg)    Physical Exam  Constitutional: She is oriented to person, place, and time and well-developed, well-nourished, and in no distress.  HENT:  Head: Normocephalic and atraumatic.  Mouth/Throat: Oropharynx is clear and moist. No oropharyngeal exudate.  Eyes: Pupils are equal, round, and reactive to light.  Cardiovascular: Normal rate and regular rhythm.  Pulmonary/Chest: No respiratory distress. She has no wheezes.  Abdominal: Soft. Bowel sounds are normal. She exhibits no distension and no mass. There is no abdominal tenderness. There is no rebound and no guarding.  Musculoskeletal:        General: No tenderness or edema. Normal range of motion.     Cervical  back: Normal range of motion and neck supple.  Neurological: She is alert and oriented to person, place, and time.  Skin: Skin is warm.  Psychiatric: Affect normal.     LABORATORY DATA:  I have reviewed the data as listed Lab Results  Component Value Date   WBC 6.5 09/13/2019   HGB 11.4 (L) 09/13/2019   HCT 36.8 09/13/2019   MCV 94.6 09/13/2019   PLT 260 09/13/2019   Recent Labs    08/18/19 0711 08/25/19 1340 08/27/19 1022 09/13/19 1148  NA 136 142 140 139  K 2.7* 3.8 3.6 3.8  CL 105 102 107 107  CO2 '22 29 23 24  ' GLUCOSE 126* 128* 105* 93  BUN '14 17 12 13  ' CREATININE 0.68 0.81 0.73 0.69  CALCIUM 8.3* 9.7 8.6* 9.3  GFRNONAA >60 >60 >60 >60  GFRAA >60 >60 >60 >60  PROT 7.2 7.0  --  7.1  ALBUMIN 3.5 3.0*  --  3.6  AST 14* 23  --  14*  ALT 13 28  --  17  ALKPHOS 72 66  --  49  BILITOT 0.8 0.7  --  0.4    RADIOGRAPHIC STUDIES: I have personally reviewed the radiological images as listed and agreed with the  findings in the report. X-ray chest PA and lateral  Result Date: 08/25/2019 CLINICAL DATA:  Epigastric pain after colon surgery on 08/20/2019. EXAM: CHEST - 2 VIEW COMPARISON:  CT scan 08/17/2019 FINDINGS: Asymmetric elevation left hemidiaphragm. Left base atelectasis noted with small left pleural effusion. Bandlike opacity in the right hilar region likely atelectasis or scar. The cardiopericardial silhouette is within normal limits for size. The visualized bony structures of the thorax are intact. Densely calcified lesion in the left upper quadrant was better characterized on recent CT is positioned posterior to the gastric fundus. IMPRESSION: 1. Asymmetric elevation left hemidiaphragm with left base atelectasis and small left pleural effusion. 2. Bandlike opacity in the right hilar region likely atelectasis or scar. Electronically Signed   By: Misty Stanley M.D.   On: 08/25/2019 11:52   DG Abdomen 1 View  Result Date: 08/18/2019 CLINICAL DATA:  Evaluate NG tube placement EXAM: ABDOMEN - 1 VIEW COMPARISON:  06/17/2019 FINDINGS: The nasogastric tube tip is in the proximal stomach. This is just below the level of the GE junction. The side port is above the GE junction. Gaseous distension of the colon is again noted as seen previously. Lung volumes are low and there is bibasilar atelectasis. IMPRESSION: 1. Tip of NG tube is just below the GE junction with side port in the distal esophagus. Recommend advancing the tube by at least 4 cm. 2. Gaseous distension of the colon compatible with colonic obstruction. Electronically Signed   By: Kerby Moors M.D.   On: 08/18/2019 04:35   CT ABDOMEN PELVIS W CONTRAST  Addendum Date: 08/17/2019   ADDENDUM REPORT: 08/17/2019 17:47 ADDENDUM: These results were called by telephone at the time of interpretation on 08/17/2019 at 5:47 pm to provider Dr. Wynetta Emery, who verbally acknowledged these results. Electronically Signed   By: Constance Holster M.D.   On: 08/17/2019  17:47   Result Date: 08/17/2019 CLINICAL DATA:  Constipation EXAM: CT ABDOMEN AND PELVIS WITH CONTRAST TECHNIQUE: Multidetector CT imaging of the abdomen and pelvis was performed using the standard protocol following bolus administration of intravenous contrast. CONTRAST:  159m OMNIPAQUE IOHEXOL 300 MG/ML  SOLN COMPARISON:  CT renal study dated March 15, 2018. FINDINGS: Lower chest: There is a small  4 mm pulmonary nodule in the left lower lobe (axial series 4, image 10). This nodule was not clearly present on prior CT. The heart size is normal. Hepatobiliary: There is a 1.2 cm hypoattenuating area in the left hepatic lobe (axial series 2, image 23). This was not clearly present on the prior non-contrast enhanced examination. The gallbladder is distended. There is no obvious filling defect. No CT evidence for acute cholecystitis. Pancreas: Normal contours without ductal dilatation. No peripancreatic fluid collection. Spleen: The spleen is not significantly enlarged. 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 Adrenals/Urinary Tract: --Adrenal glands: No adrenal hemorrhage. --Right kidney/ureter: No hydronephrosis or perinephric hematoma. --Left kidney/ureter: No hydronephrosis or perinephric hematoma. --Urinary bladder: Unremarkable. Stomach/Bowel: --Stomach/Duodenum: No hiatal hernia or other gastric abnormality. Normal duodenal course and caliber. --Small bowel: No dilatation or inflammation. --Colon: There is an area of circumferential wall thickening involving the distal descending colon. This area of wall thickening measures approximately 5.3 x 3.8 cm and has progressed since the prior study. There are enlarged adjacent regional lymph nodes. This mass is obstructive as the colon proximal to this point is significantly dilated with air-fluid levels. For example, the cecum is dilated and measures approximately 8.3 cm in diameter.  --Appendix: Not visualized. No right lower quadrant inflammation or free fluid. Vascular/Lymphatic: Normal course and caliber of the major abdominal vessels. --No retroperitoneal lymphadenopathy. --again noted are findings of a missed the mesentery, similar to prior study. There is a pathologically enlarged 1.6 cm lymph node in the left mid abdomen along the drainage of the IMV (axial series 2, image 53). --No pelvic or inguinal lymphadenopathy. Reproductive: Status post hysterectomy. No adnexal mass. Other: No ascites or free air. The abdominal wall is normal. Musculoskeletal. No acute displaced fractures. IMPRESSION: 1. Obstructing 5.3 x 3.8 cm mass in the distal descending colon. There is significant dilatation of the colon proximal to this mass consistent with a moderate to high-grade level of obstruction. The colon beyond this mass is significantly decompressed. This mass is consistent with colorectal carcinoma until proven otherwise. Surgical consultation is recommended. 2. New hypoattenuating 1.2 cm mass in the left hepatic lobe is concerning for hepatic metastatic disease. 3. Pathologically enlarged 1.6 cm lymph node along the course of the IMV is consistent with nodal metastatic disease. 4. Small 4 mm pulmonary nodule in the left lower lobe, not clearly seen on prior studies. Attention on follow-up examinations is recommended as metastatic disease to the lungs cannot be excluded. 5. Distended gallbladder without evidence for acute cholecystitis. These results will be called to the ordering clinician or representative by the Radiologist Assistant, and communication documented in the PACS or zVision Dashboard. Electronically Signed: By: Constance Holster M.D. On: 08/17/2019 17:36   DG SMALL BOWEL W SINGLE CM (SOL OR THIN BA)  Result Date: 08/28/2019 CLINICAL DATA:  Question small-bowel obstruction. NG tube in place. Prior recent colectomy and colostomy creation. EXAM: SMALL BOWEL SERIES COMPARISON:  No  prior. TECHNIQUE: Diluted Omnipaque administered to the patient through her NG tube. Serial imaging was obtained of the abdomen. FLUOROSCOPY TIME:  Fluoroscopy Time:  2 minutes 6 seconds Radiation Exposure Index (if provided by the fluoroscopic device): 49.0 mGy FINDINGS: Dilute Omnipaque administered into the patient's NG tube and serial images obtained. NG tube noted with tip in the stomach. The stomach appeared slightly a tiny. Mild gastroesophageal reflux noted. Small bowel appears normal. No evidence of small-bowel obstruction. Small-bowel  empties right rapidly into the right colon and ostomy bag. IMPRESSION: 1. NG tube is noted with tip in stomach. Mild gastric at cannot be excluded. Mild gastroesophageal reflux noted. 2. Small bowel appears normal. No evidence of small-bowel obstruction. Electronically Signed   By: Marcello Moores  Register   On: 08/28/2019 11:51   DG Abd 2 Views  Result Date: 08/27/2019 CLINICAL DATA:  78 year old female with small bowel obstruction. NG placement. EXAM: ABDOMEN - 2 VIEW COMPARISON:  Radiograph dated 08/25/2019. FINDINGS: Enteric tube with tip and side-port in the stomach. No bowel dilatation identified. Air is noted in the descending colon. 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 The osseous structures are intact. Midline anterior abdominal skin staples noted. IMPRESSION: 1. Enteric tube with tip and side-port in the stomach. 2. No bowel dilatation. Electronically Signed   By: Anner Crete M.D.   On: 08/27/2019 10:27   DG Abd 2 Views  Result Date: 08/25/2019 CLINICAL DATA:  Status post colostomy for colon lesion. Now the epigastric pain. EXAM: ABDOMEN - 2 VIEW COMPARISON:  X-ray 08/18/2019.  CT scan 08/17/2019 FINDINGS: Upright abdomen shows no intraperitoneal free air. Large air-fluid level in the stomach suggest gastric distention. There is basilar  atelectasis on the left. Supine film confirms the gastric distension. There is no evidence for gaseous bowel dilation to suggest obstruction. Dense calcification in the left upper quadrant is consistent with a densely calcified lesion seen posterior to the gastric fundus on previous CT. Skin staples overlie the lower midline abdomen. IMPRESSION: 1. Gastric distention without visible small bowel or colonic dilatation. 2. No evidence for intraperitoneal free air. Electronically Signed   By: Misty Stanley M.D.   On: 08/25/2019 11:59   DG Abd Portable 1V  Result Date: 08/25/2019 CLINICAL DATA:  NG tube placement EXAM: PORTABLE ABDOMEN - 1 VIEW COMPARISON:  None. FINDINGS: The enteric tube projects over the gastric body/fundus. The bowel gas pattern is nonspecific and nonobstructive. 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 IMPRESSION: NG tube projects over the gastric body/fundus. Electronically Signed   By: Constance Holster M.D.   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  Left lobe of liver peripheral-1.2 cm lesion concerning for metastasis; 4 mm lung nodule. Recommend PET for further evaluation.   #I reviewed the pathology and staging with the patient and daughter in detail.  Reviewed imaging independently summarized above. Discussed the prognosis-depends upon stage of disease; treatment response/tolerance to therapy.    # Discussed that goal of treatment would be cure-if stage III cancer.  If stage IV cancer-in general incurable; treatments are palliative.  However patient did not improve the resection-5-year survival would be around 30%. Discussed regarding possibility of liver resection-the higher center.  # I discussed that FOLFOX chemotherapy is given  every 2 weeks.  Avastin potentially could be added to enhance response rates.  The number of cycles/duration of treatment difficult to define at this time. My preference would be to proceed with "neo-adjuvant chemo".   # Discussed with Dr.Byrnett; re: above plan; and also re: port placement.   Thank you Dr.Byrnett for allowing me to participate in the care of your pleasant patient. Please do  not hesitate to contact me with questions or concerns in the interim.  # DISPOSITION: # PET SCAN asap # chemo ed next week # labs today- cbc/cmp/cea # follow up in 1-2 days after PET scan -Dr.B  All questions were answered. The patient knows to call the clinic with any problems, questions or concerns.    Cammie Sickle, MD 09/13/2019 3:14 PM

## 2019-09-13 NOTE — Progress Notes (Signed)
Introduced Therapist, nutritional. Provided contact information for future needs. I have reached out to my contact at [facility] to help her ger increased education and hands on care for her colostomy. She is not comfortable with caring for it.  She is able to empty the bag and needs further hands on care for changing the entire appliance. She had a leak in one of her bags and reports her stomach is very weak and she just could not deal with it. She does have a daughter as close as five minutes away but fears if something happens during the night she cannot handle it. She hopes to have her ostomy reversed in 4-6 months.

## 2019-09-13 NOTE — Progress Notes (Signed)
START OFF PATHWAY REGIMEN - Colorectal   [id number] + Bevacizumab (Leucovorin IV D1 + Fluorouracil IV D1/CIV D1,2 + Oxaliplatin IV D1 + Bevacizumab IV D1) q14 Days:   A cycle is every 14 days:     Bevacizumab-xxxx      Oxaliplatin      Leucovorin      Fluorouracil      Fluorouracil   **Always confirm dose/schedule in your pharmacy ordering system**  Patient Characteristics: Preoperative or Nonsurgical Candidate (Clinical Staging), Distant Metastasis Tumor Location: Colon Therapeutic Status: Preoperative or Nonsurgical Candidate (Clinical Staging) AJCC T Category: cT4a AJCC N Category: cN2 AJCC M Category: cM1c AJCC 8 Stage Grouping: IVC Intent of Therapy: Non-Curative / Palliative Intent, Discussed with Patient

## 2019-09-14 ENCOUNTER — Telehealth: Payer: Self-pay

## 2019-09-14 LAB — CEA: CEA: 6.3 ng/mL — ABNORMAL HIGH (ref 0.0–4.7)

## 2019-09-14 NOTE — Telephone Encounter (Signed)
Called placed to Courtney Jefferson. She preferred I speak with her daughter Courtney Jefferson. Discussed surgical preference if resection is an option down the line. They prefer evaluation in [place] if that time arises. Dr. Lucilla Edin both favor upfront neoadjuvant chemotherapy. Dr. Berdine Dance office will arrange port placement.

## 2019-09-17 ENCOUNTER — Other Ambulatory Visit: Payer: Self-pay | Admitting: General Surgery

## 2019-09-17 NOTE — Patient Instructions (Signed)
Oxaliplatin Injection What is this medicine? OXALIPLATIN (ox AL i PLA tin) is a chemotherapy drug. It targets fast dividing cells, like cancer cells, and causes these cells to die. This medicine is used to treat cancers of the colon and rectum, and many other cancers. This medicine may be used for other purposes; ask your health care provider or pharmacist if you have questions. COMMON BRAND NAME(S): Eloxatin What should I tell my health care provider before I take this medicine? They need to know if you have any of these conditions:  kidney disease  an unusual or allergic reaction to oxaliplatin, other chemotherapy, other medicines, foods, dyes, or preservatives  pregnant or trying to get pregnant  breast-feeding How should I use this medicine? This drug is given as an infusion into a vein. It is administered in a hospital or clinic by a specially trained health care professional. Talk to your pediatrician regarding the use of this medicine in children. Special care may be needed. Overdosage: If you think you have taken too much of this medicine contact a poison control center or emergency room at once. NOTE: This medicine is only for you. Do not share this medicine with others. What if I miss a dose? It is important not to miss a dose. Call your doctor or health care professional if you are unable to keep an appointment. What may interact with this medicine?  medicines to increase blood counts like filgrastim, pegfilgrastim, sargramostim  probenecid  some antibiotics like amikacin, gentamicin, neomycin, polymyxin B, streptomycin, tobramycin  zalcitabine Talk to your doctor or health care professional before taking any of these medicines:  acetaminophen  aspirin  ibuprofen  ketoprofen  naproxen This list may not describe all possible interactions. Give your health care provider a list of all the medicines, herbs, non-prescription drugs, or dietary supplements you use. Also  tell them if you smoke, drink alcohol, or use illegal drugs. Some items may interact with your medicine. What should I watch for while using this medicine? Your condition will be monitored carefully while you are receiving this medicine. You will need important blood work done while you are taking this medicine. This medicine can make you more sensitive to cold. Do not drink cold drinks or use ice. Cover exposed skin before coming in contact with cold temperatures or cold objects. When out in cold weather wear warm clothing and cover your mouth and nose to warm the air that goes into your lungs. Tell your doctor if you get sensitive to the cold. This drug may make you feel generally unwell. This is not uncommon, as chemotherapy can affect healthy cells as well as cancer cells. Report any side effects. Continue your course of treatment even though you feel ill unless your doctor tells you to stop. In some cases, you may be given additional medicines to help with side effects. Follow all directions for their use. Call your doctor or health care professional for advice if you get a fever, chills or sore throat, or other symptoms of a cold or flu. Do not treat yourself. This drug decreases your body's ability to fight infections. Try to avoid being around people who are sick. This medicine may increase your risk to bruise or bleed. Call your doctor or health care professional if you notice any unusual bleeding. Be careful brushing and flossing your teeth or using a toothpick because you may get an infection or bleed more easily. If you have any dental work done, tell your dentist you   are receiving this medicine. Avoid taking products that contain aspirin, acetaminophen, ibuprofen, naproxen, or ketoprofen unless instructed by your doctor. These medicines may hide a fever. Do not become pregnant while taking this medicine. Women should inform their doctor if they wish to become pregnant or think they might be  pregnant. There is a potential for serious side effects to an unborn child. Talk to your health care professional or pharmacist for more information. Do not breast-feed an infant while taking this medicine. Call your doctor or health care professional if you get diarrhea. Do not treat yourself. What side effects may I notice from receiving this medicine? Side effects that you should report to your doctor or health care professional as soon as possible:  allergic reactions like skin rash, itching or hives, swelling of the face, lips, or tongue  low blood counts - This drug may decrease the number of white blood cells, red blood cells and platelets. You may be at increased risk for infections and bleeding.  signs of infection - fever or chills, cough, sore throat, pain or difficulty passing urine  signs of decreased platelets or bleeding - bruising, pinpoint red spots on the skin, black, tarry stools, nosebleeds  signs of decreased red blood cells - unusually weak or tired, fainting spells, lightheadedness  breathing problems  chest pain, pressure  cough  diarrhea  jaw tightness  mouth sores  nausea and vomiting  pain, swelling, redness or irritation at the injection site  pain, tingling, numbness in the hands or feet  problems with balance, talking, walking  redness, blistering, peeling or loosening of the skin, including inside the mouth  trouble passing urine or change in the amount of urine Side effects that usually do not require medical attention (report to your doctor or health care professional if they continue or are bothersome):  changes in vision  constipation  hair loss  loss of appetite  metallic taste in the mouth or changes in taste  stomach pain This list may not describe all possible side effects. Call your doctor for medical advice about side effects. You may report side effects to FDA at 1-800-FDA-1088. Where should I keep my medicine? This drug  is given in a hospital or clinic and will not be stored at home. NOTE: This sheet is a summary. It may not cover all possible information. If you have questions about this medicine, talk to your doctor, pharmacist, or health care provider.  2020 [employer] Standard (2008-04-16 17:22:47) Fluorouracil, 5-FU injection What is this medicine? FLUOROURACIL, 5-FU (flure oh YOOR a sil) is a chemotherapy drug. It slows the growth of cancer cells. This medicine is used to treat many types of cancer like breast cancer, colon or rectal cancer, pancreatic cancer, and stomach cancer. This medicine may be used for other purposes; ask your health care provider or pharmacist if you have questions. COMMON BRAND NAME(S): Adrucil What should I tell my health care provider before I take this medicine? They need to know if you have any of these conditions:  blood disorders  dihydropyrimidine dehydrogenase (DPD) deficiency  infection (especially a virus infection such as chickenpox, cold sores, or herpes)  kidney disease  liver disease  malnourished, poor nutrition  recent or ongoing radiation therapy  an unusual or allergic reaction to fluorouracil, other chemotherapy, other medicines, foods, dyes, or preservatives  pregnant or trying to get pregnant  breast-feeding How should I use this medicine? This drug is given as an infusion or injection into a vein.   It is administered in a hospital or clinic by a specially trained health care professional. Talk to your pediatrician regarding the use of this medicine in children. Special care may be needed. Overdosage: If you think you have taken too much of this medicine contact a poison control center or emergency room at once. NOTE: This medicine is only for you. Do not share this medicine with others. What if I miss a dose? It is important not to miss your dose. Call your doctor or health care professional if you are unable to keep an appointment. What  may interact with this medicine?  allopurinol  cimetidine  dapsone  digoxin  hydroxyurea  leucovorin  levamisole  medicines for seizures like ethotoin, fosphenytoin, phenytoin  medicines to increase blood counts like filgrastim, pegfilgrastim, sargramostim  medicines that treat or prevent blood clots like warfarin, enoxaparin, and dalteparin  methotrexate  metronidazole  pyrimethamine  some other chemotherapy drugs like busulfan, cisplatin, estramustine, vinblastine  trimethoprim  trimetrexate  vaccines Talk to your doctor or health care professional before taking any of these medicines:  acetaminophen  aspirin  ibuprofen  ketoprofen  naproxen This list may not describe all possible interactions. Give your health care provider a list of all the medicines, herbs, non-prescription drugs, or dietary supplements you use. Also tell them if you smoke, drink alcohol, or use illegal drugs. Some items may interact with your medicine. What should I watch for while using this medicine? Visit your doctor for checks on your progress. This drug may make you feel generally unwell. This is not uncommon, as chemotherapy can affect healthy cells as well as cancer cells. Report any side effects. Continue your course of treatment even though you feel ill unless your doctor tells you to stop. In some cases, you may be given additional medicines to help with side effects. Follow all directions for their use. Call your doctor or health care professional for advice if you get a fever, chills or sore throat, or other symptoms of a cold or flu. Do not treat yourself. This drug decreases your body's ability to fight infections. Try to avoid being around people who are sick. This medicine may increase your risk to bruise or bleed. Call your doctor or health care professional if you notice any unusual bleeding. Be careful brushing and flossing your teeth or using a toothpick because you may  get an infection or bleed more easily. If you have any dental work done, tell your dentist you are receiving this medicine. Avoid taking products that contain aspirin, acetaminophen, ibuprofen, naproxen, or ketoprofen unless instructed by your doctor. These medicines may hide a fever. Do not become pregnant while taking this medicine. Women should inform their doctor if they wish to become pregnant or think they might be pregnant. There is a potential for serious side effects to an unborn child. Talk to your health care professional or pharmacist for more information. Do not breast-feed an infant while taking this medicine. Men should inform their doctor if they wish to father a child. This medicine may lower sperm counts. Do not treat diarrhea with over the counter products. Contact your doctor if you have diarrhea that lasts more than 2 days or if it is severe and watery. This medicine can make you more sensitive to the sun. Keep out of the sun. If you cannot avoid being in the sun, wear protective clothing and use sunscreen. Do not use sun lamps or tanning beds/booths. What side effects may I notice from   receiving this medicine? Side effects that you should report to your doctor or health care professional as soon as possible:  allergic reactions like skin rash, itching or hives, swelling of the face, lips, or tongue  low blood counts - this medicine may decrease the number of white blood cells, red blood cells and platelets. You may be at increased risk for infections and bleeding.  signs of infection - fever or chills, cough, sore throat, pain or difficulty passing urine  signs of decreased platelets or bleeding - bruising, pinpoint red spots on the skin, black, tarry stools, blood in the urine  signs of decreased red blood cells - unusually weak or tired, fainting spells, lightheadedness  breathing problems  changes in vision  chest pain  mouth sores  nausea and vomiting  pain,  swelling, redness at site where injected  pain, tingling, numbness in the hands or feet  redness, swelling, or sores on hands or feet  stomach pain  unusual bleeding Side effects that usually do not require medical attention (report to your doctor or health care professional if they continue or are bothersome):  changes in finger or toe nails  diarrhea  dry or itchy skin  hair loss  headache  loss of appetite  sensitivity of eyes to the light  stomach upset  unusually teary eyes This list may not describe all possible side effects. Call your doctor for medical advice about side effects. You may report side effects to FDA at 1-800-FDA-1088. Where should I keep my medicine? This drug is given in a hospital or clinic and will not be stored at home. NOTE: This sheet is a summary. It may not cover all possible information. If you have questions about this medicine, talk to your doctor, pharmacist, or health care provider.  2020 [employer] Standard (2008-01-24 13:53:16) Leucovorin injection What is this medicine? LEUCOVORIN (loo koe VOR in) is used to prevent or treat the harmful effects of some medicines. This medicine is used to treat anemia caused by a low amount of folic acid in the body. It is also used with 5-fluorouracil (5-FU) to treat colon cancer. This medicine may be used for other purposes; ask your health care provider or pharmacist if you have questions. What should I tell my health care provider before I take this medicine? They need to know if you have any of these conditions:  anemia from low levels of vitamin B-12 in the blood  an unusual or allergic reaction to leucovorin, folic acid, other medicines, foods, dyes, or preservatives  pregnant or trying to get pregnant  breast-feeding How should I use this medicine? This medicine is for injection into a muscle or into a vein. It is given by a health care professional in a hospital or clinic setting. Talk  to your pediatrician regarding the use of this medicine in children. Special care may be needed. Overdosage: If you think you have taken too much of this medicine contact a poison control center or emergency room at once. NOTE: This medicine is only for you. Do not share this medicine with others. What if I miss a dose? This does not apply. What may interact with this medicine?  capecitabine  fluorouracil  phenobarbital  phenytoin  primidone  trimethoprim-sulfamethoxazole This list may not describe all possible interactions. Give your health care provider a list of all the medicines, herbs, non-prescription drugs, or dietary supplements you use. Also tell them if you smoke, drink alcohol, or use illegal drugs. Some items may   interact with your medicine. What should I watch for while using this medicine? Your condition will be monitored carefully while you are receiving this medicine. This medicine may increase the side effects of 5-fluorouracil, 5-FU. Tell your doctor or health care professional if you have diarrhea or mouth sores that do not get better or that get worse. What side effects may I notice from receiving this medicine? Side effects that you should report to your doctor or health care professional as soon as possible:  allergic reactions like skin rash, itching or hives, swelling of the face, lips, or tongue  breathing problems  fever, infection  mouth sores  unusual bleeding or bruising  unusually weak or tired Side effects that usually do not require medical attention (report to your doctor or health care professional if they continue or are bothersome):  constipation or diarrhea  loss of appetite  nausea, vomiting This list may not describe all possible side effects. Call your doctor for medical advice about side effects. You may report side effects to FDA at 1-800-FDA-1088. Where should I keep my medicine? This drug is given in a hospital or clinic and  will not be stored at home. NOTE: This sheet is a summary. It may not cover all possible information. If you have questions about this medicine, talk to your doctor, pharmacist, or health care provider.  2020 [employer] Standard (2008-03-26 16:50:29)  

## 2019-09-18 ENCOUNTER — Other Ambulatory Visit: Payer: Self-pay

## 2019-09-18 ENCOUNTER — Inpatient Hospital Stay: Payer: Medicare Other

## 2019-09-18 ENCOUNTER — Inpatient Hospital Stay (HOSPITAL_BASED_OUTPATIENT_CLINIC_OR_DEPARTMENT_OTHER): Payer: Medicare Other | Admitting: Nurse Practitioner

## 2019-09-18 DIAGNOSIS — C186 Malignant neoplasm of descending colon: Secondary | ICD-10-CM | POA: Diagnosis not present

## 2019-09-18 NOTE — Progress Notes (Signed)
Virtual Visit Progress Note  [place] NOTE [facility]  Telephone:([phone] [phone]  Patient Care Team: Dion Body, MD as PCP - General (Family Medicine) Clent Jacks, RN as Oncology Nurse Navigator   Name of the patient: Courtney Jefferson  [id number]  09/14/1941   Date of visit: 09/18/19  I connected with Courtney Jefferson on 09/18/19 at  3:00 PM EST by telephone visit and verified that I am speaking with the correct person using two identifiers.   I discussed the limitations, risks, security and privacy concerns of performing an evaluation and management service by telemedicine and the availability of in-person appointments. I also discussed with the patient that there may be a patient responsible charge related to this service. The patient expressed understanding and agreed to proceed.   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   # PALLIATIVE CARE EVALUATION: P  # PAIN MANAGEMENT: none   DIAGNOSIS: COLON CA  STAGE:   IV      ;  GOALS: control  CURRENT/MOST RECENT THERAPY : FOLFOX    Cancer of left colon ([place])  09/13/2019 Initial Diagnosis   Cancer of left colon ([place])   09/13/2019 -  Chemotherapy   The patient had PALONOSETRON HCL  INJECTION 0.25 MG/5ML, 0.25 mg, Intravenous,  Once, 0 of 4 cycles leucovorin 672 mg in dextrose 5 % 250 mL infusion, 400 mg/m2, Intravenous,  Once, 0 of 4 cycles oxaliplatin (ELOXATIN) 145 mg in dextrose 5 % 500 mL chemo infusion, 85 mg/m2, Intravenous,  Once, 0 of 4 cycles fluorouracil (ADRUCIL) chemo injection 650 mg, 400 mg/m2, Intravenous,  Once, 0 of 4 cycles fluorouracil (ADRUCIL) 4,050 mg in sodium chloride 0.9 % 69 mL chemo infusion, 2,400 mg/m2, Intravenous, 1 Day/Dose, 0 of 4 cycles bevacizumab-bvzr (ZIRABEV) 300 mg in sodium chloride 0.9 % 100 mL chemo infusion, 5 mg/kg, Intravenous,  Once, 0 of 4 cycles  for chemotherapy treatment.      Interval history-  Courtney Jefferson, 78 year old female, newly diagnosed with colon cancer, who presents to chemo care clinic today for initial meeting in preparation for starting chemotherapy. I introduced the chemo care clinic and we discussed that the role of the clinic is to assist those who are at an increased risk of emergency room visits and/or complications during the course of chemotherapy treatment. We discussed that the increased risk takes into account factors such as age, performance status, and co-morbidities. We also discussed that for some, this might include barriers to care such as not having a primary care provider, lack of insurance/transportation, or not being able to afford medications. We discussed that the goal of the program is to help prevent unplanned ER visits and help reduce complications during chemotherapy. We do this by discussing specific risk factors to each individual and identifying ways that we can help improve these risk factors and  reduce barriers to care.  We discussed that she was identified as high risk of hospitalization and ER visits based on previous hospitalization and ER utilization, Medicare status, history of liver disease, and not identifying is being in a relationship.  ECOG FS:0 - Asymptomatic  Review of  systems- Review of Systems  Constitutional: Negative for chills, fever, malaise/fatigue and weight loss.  HENT: Negative for hearing loss, nosebleeds, sore throat and tinnitus.   Eyes: Negative for blurred vision and double vision.  Respiratory: Negative for cough, hemoptysis, shortness of breath and wheezing.   Cardiovascular: Negative for chest pain, palpitations and leg swelling.  Gastrointestinal: Negative for abdominal pain, blood in stool, constipation, diarrhea, melena, nausea and vomiting.  Genitourinary: Negative for dysuria and urgency.  Musculoskeletal: Negative for back pain, falls, joint pain and myalgias.  Skin: Negative for itching and rash.  Neurological: Negative for dizziness, tingling, sensory change, loss of consciousness, weakness and headaches.  Endo/Heme/Allergies: Negative for environmental allergies. Does not bruise/bleed easily.  Psychiatric/Behavioral: Negative for depression. The patient is not nervous/anxious and does not have insomnia.      Current treatment-plan to start FOLFOX and Avastin  No Known Allergies  Past Medical History:  Diagnosis Date  . Hypertension     Past Surgical History:  Procedure Laterality Date  . ABDOMINAL HYSTERECTOMY     partial  . COLECTOMY WITH COLOSTOMY CREATION/HARTMANN PROCEDURE  08/18/2019   Procedure: COLECTOMY WITH COLOSTOMY CREATION/HARTMANN PROCEDURE;  Surgeon: Robert Bellow, MD;  Location: [facility] ORS;  Service: General;;  . LAPAROTOMY N/A 08/18/2019   Procedure: Exploratory Laparotomy;  Surgeon: Robert Bellow, MD;  Location: [facility] ORS;  Service: General;  Laterality: N/A;    Social History   Socioeconomic History  . Marital status: Widowed    Spouse name: Not on file  . Number of children: Not on file  . Years of education: Not on file  . Highest education level: Not on file  Occupational History  . Not on file  Tobacco Use  . Smoking status: Former Research scientist (life sciences)  . Smokeless tobacco: Never Used    Substance and Sexual Activity  . Alcohol use: No  . Drug use: No  . Sexual activity: Not Currently    Birth control/protection: Surgical  Other Topics Concern  . Not on file  Social History Narrative   Lives in [place]; no smoking; no alcohol; [employer]. Lives by self; daughters live close by.    Social Determinants of Health   Financial Resource Strain:   . Difficulty of Paying Living Expenses: Not on file  Food Insecurity:   . Worried About Charity fundraiser in the Last Year: Not on file  . Ran Out of Food in the Last Year: Not on file  Transportation Needs:   . Lack of Transportation (Medical): Not on file  . Lack of Transportation (Non-Medical): Not on file  Physical Activity:   . Days of Exercise per Week: Not on file  . Minutes of Exercise per Session: Not on file  Stress:   . Feeling of Stress : Not on file  Social Connections:   . Frequency of Communication with Friends and Family: Not on file  . Frequency of Social Gatherings with Friends and Family: Not on file  . Attends Religious Services: Not on file  . Active Member of Clubs or Organizations: Not on file  . Attends Archivist Meetings: Not on file  . Marital Status: Not on file  Intimate Partner Violence:   .  Fear of Current or Ex-Partner: Not on file  . Emotionally Abused: Not on file  . Physically Abused: Not on file  . Sexually Abused: Not on file    Family History  Problem Relation Age of Onset  . Diabetes Sister   . Cancer Brother        died young     Current Outpatient Medications:  .  acetaminophen (TYLENOL) 325 MG tablet, Take 2 tablets (650 mg total) by mouth every 6 (six) hours as needed., Disp: 60 tablet, Rfl: 0 .  Black Cohosh 40 MG CAPS, Take 40 mg by mouth daily. , Disp: , Rfl:  .  calcium carbonate (OSCAL) 1500 (600 Ca) MG TABS tablet, Take 600 mg of elemental calcium by mouth 2 (two) times daily with a meal. , Disp: , Rfl:  .  enoxaparin (LOVENOX) 40 MG/0.4ML  injection, Inject 0.4 mLs (40 mg total) into the skin daily for 12 days. (Patient not taking: Reported on 09/18/2019), Disp: 0.4 mL, Rfl: 0 .  metoCLOPramide (REGLAN) 5 MG tablet, Take 1 tablet (5 mg total) by mouth 4 (four) times daily -  before meals and at bedtime. (Patient not taking: Reported on 09/13/2019), Disp: 30 tablet, Rfl: 0  Physical exam: Exam limited due to telemedicine  CMP Latest Ref Rng & Units 09/13/2019  Glucose 70 - 99 mg/dL 93  BUN 8 - 23 mg/dL 13  Creatinine 0.44 - 1.00 mg/dL 0.69  Sodium 135 - 145 mmol/L 139  Potassium 3.5 - 5.1 mmol/L 3.8  Chloride 98 - 111 mmol/L 107  CO2 22 - 32 mmol/L 24  Calcium 8.9 - 10.3 mg/dL 9.3  Total Protein 6.5 - 8.1 g/dL 7.1  Total Bilirubin 0.3 - 1.2 mg/dL 0.4  Alkaline Phos 38 - 126 U/L 49  AST 15 - 41 U/L 14(L)  ALT 0 - 44 U/L 17   CBC Latest Ref Rng & Units 09/13/2019  WBC 4.0 - 10.5 K/uL 6.5  Hemoglobin 12.0 - 15.0 g/dL 11.4(L)  Hematocrit 36.0 - 46.0 % 36.8  Platelets 150 - 400 K/uL 260    No images are attached to the encounter.  X-ray chest PA and lateral  Result Date: 08/25/2019 CLINICAL DATA:  Epigastric pain after colon surgery on 08/20/2019. EXAM: CHEST - 2 VIEW COMPARISON:  CT scan 08/17/2019 FINDINGS: Asymmetric elevation left hemidiaphragm. Left base atelectasis noted with small left pleural effusion. Bandlike opacity in the right hilar region likely atelectasis or scar. The cardiopericardial silhouette is within normal limits for size. The visualized bony structures of the thorax are intact. Densely calcified lesion in the left upper quadrant was better characterized on recent CT is positioned posterior to the gastric fundus. IMPRESSION: 1. Asymmetric elevation left hemidiaphragm with left base atelectasis and small left pleural effusion. 2. Bandlike opacity in the right hilar region likely atelectasis or scar. Electronically Signed   By: Misty Stanley M.D.   On: 08/25/2019 11:52   DG SMALL BOWEL W SINGLE CM (SOL  OR THIN BA)  Result Date: 08/28/2019 CLINICAL DATA:  Question small-bowel obstruction. NG tube in place. Prior recent colectomy and colostomy creation. EXAM: SMALL BOWEL SERIES COMPARISON:  No prior. TECHNIQUE: Diluted Omnipaque administered to the patient through her NG tube. Serial imaging was obtained of the abdomen. FLUOROSCOPY TIME:  Fluoroscopy Time:  2 minutes 6 seconds Radiation Exposure Index (if provided by the fluoroscopic device): 49.0 mGy FINDINGS: Dilute Omnipaque administered into the patient's NG tube and serial images obtained. NG tube noted with tip  in the stomach. The stomach appeared slightly a tiny. Mild gastroesophageal reflux noted. Small bowel appears normal. No evidence of small-bowel obstruction. Small-bowel empties right rapidly into the right colon and ostomy bag. IMPRESSION: 1. NG tube is noted with tip in stomach. Mild gastric at cannot be excluded. Mild gastroesophageal reflux noted. 2. Small bowel appears normal. No evidence of small-bowel obstruction. Electronically Signed   By: Marcello Moores  Register   On: 08/28/2019 11:51   DG Abd 2 Views  Result Date: 08/27/2019 CLINICAL DATA:  78 year old female with small bowel obstruction. NG placement. EXAM: ABDOMEN - 2 VIEW COMPARISON:  Radiograph dated 08/25/2019. FINDINGS: Enteric tube with tip and side-port in the stomach. No bowel dilatation identified. Air is noted in the descending colon. 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 The osseous structures are intact. Midline anterior abdominal skin staples noted. IMPRESSION: 1. Enteric tube with tip and side-port in the stomach. 2. No bowel dilatation. Electronically Signed   By: Anner Crete M.D.   On: 08/27/2019 10:27   DG Abd 2 Views  Result Date: 08/25/2019 CLINICAL DATA:  Status post colostomy for colon lesion. Now the epigastric pain. EXAM: ABDOMEN - 2  VIEW COMPARISON:  X-ray 08/18/2019.  CT scan 08/17/2019 FINDINGS: Upright abdomen shows no intraperitoneal free air. Large air-fluid level in the stomach suggest gastric distention. There is basilar atelectasis on the left. Supine film confirms the gastric distension. There is no evidence for gaseous bowel dilation to suggest obstruction. Dense calcification in the left upper quadrant is consistent with a densely calcified lesion seen posterior to the gastric fundus on previous CT. Skin staples overlie the lower midline abdomen. IMPRESSION: 1. Gastric distention without visible small bowel or colonic dilatation. 2. No evidence for intraperitoneal free air. Electronically Signed   By: Misty Stanley M.D.   On: 08/25/2019 11:59   DG Abd Portable 1V  Result Date: 08/25/2019 CLINICAL DATA:  NG tube placement EXAM: PORTABLE ABDOMEN - 1 VIEW COMPARISON:  None. FINDINGS: The enteric tube projects over the gastric body/fundus. The bowel gas pattern is nonspecific and nonobstructive. 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 IMPRESSION: NG tube projects over the gastric body/fundus. Electronically Signed   By: Constance Holster M.D.   On: 08/25/2019 17:48     Assessment and plan- Patient is a 78 y.o. female who presents to [facility] for initial meeting in preparation for starting chemotherapy for the treatment of colon cancer.   1. 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 Bary Castilla.  Invasion of visceral peritoneum.  Grade 2.  Negative margins.  August 17, 2019 liver lesion 1.2 cm/left hepatic; 4 mm left lower lobe lung nodule; distal descending colon mass; enlarged lymph nodes obstruction present.  PET September 24, 2019-solitary liver lesion; question proximal descending colon lesion/mesenteric lymph  node inflammatory versus malignancy.  MSI stable.  Care is managed by Dr. Rogue Bussing.  We again reviewed FOLFOX plus Avastin chemotherapy.  Advised that based on stage of disease treatment is given with palliative intent.  Advised that she will likely receive 3 months of chemotherapy followed by restaging.  Current plan to start chemotherapy in January.  Currently awaiting port placement.  Prescriptions for antiemetics have been sent to her pharmacy.  No cancer  related pain currently.  2. Chemo Care Clinic/High Risk for ER/Hospitalization during chemotherapy- We discussed the role of the chemo care clinic and identified patient specific risk factors. I discussed that patient was identified as high risk primarily based on: Previous hospitalizations, history of ER utilization, Medicare status, history of liver disease, and not identifying as being in a relationship.  I advised that generally these are not modifiable as admissions and ER visits were related to her diagnosis of cancer.    3. Social Determinants of Health- we discussed that social determinants of health may have significant impacts on health and outcomes for cancer patients.  Today we discussed specific social determinants of performance status, alcohol use, depression, financial needs, food insecurity, housing, interpersonal violence, social connections, stress, tobacco use, and transportation.  After lengthy discussion she denies any specific areas of need.  We discussed options for mobility including outpatient services such as physical and occupational therapy, and the care program.  I encouraged her to participate in regular physical activity as patients who do report fewer negative impacts of cancer and treatments and report less fatigue.  We discussed outpatient counseling services which are available to her if she would like.  She says she is currently very anxious about the future.  I encouraged her to discuss this with her PCP as well.  She  denies current financial insecurity but I discussed options for assistance in the future if needed including social work and Medical sales representative.  I advised her that we have a food pantry available should she have food insecurities and we can also refer her to social services if needed.  She denies housing insecurity.  Says she is well supported socially with family and friends.  I also advised her many support groups which are meeting virtually.  I encouraged her to seek options for management of stress including healthy eating, regular exercise, and participating in social interactions.  She can also contact her nurse navigator if she is interested in these programs.  She denies transportation currently but in the future if needed, we can assist with transportation  4. Palliative Care- based on stage of cancer and/or identified needs today, I will refer patient to palliative care for goals of care and advanced care planning.  We also discussed the role of the Symptom Management Clinic at [facility] for acute issues and methods of contacting clinic/provider. She denies needing specific assistance at this time and She will be followed by Mariea Clonts, RN (Nurse Navigator).   Return to clinic as scheduled or sooner if needed. Refer to palliative care.   Visit Diagnosis 1. Cancer of left colon [facility])     I discussed the assessment and treatment plan with the patient. The patient was provided an opportunity to ask questions and all were answered. The patient agreed with the plan and demonstrated an understanding of the instructions.   The patient was advised to call back or seek an in-person evaluation if the symptoms worsen or if the condition fails to improve as anticipated.   I provided 14 minutes of non face-to-face telephone visit time during this encounter, and > 50% was spent counseling as documented under my assessment & plan.  Beckey Rutter, DNP, AGNP-C [place] at [facility] [phone] (clinic)  CC: Billey Chang, NP

## 2019-09-19 ENCOUNTER — Other Ambulatory Visit: Payer: Self-pay

## 2019-09-19 ENCOUNTER — Encounter
Admission: RE | Admit: 2019-09-19 | Discharge: 2019-09-19 | Disposition: A | Discharge status: Home / Self Care | Payer: Medicare Other | Source: Ambulatory Visit | Attending: General Surgery | Admitting: General Surgery

## 2019-09-19 HISTORY — DX: Other specified postprocedural states: R11.2

## 2019-09-19 HISTORY — DX: Other specified postprocedural states: Z98.890

## 2019-09-19 HISTORY — DX: Malignant (primary) neoplasm, unspecified: C80.1

## 2019-09-19 NOTE — Patient Instructions (Addendum)
Your procedure is scheduled on: 09/26/2019 Wed Report to Same Day Surgery 2nd floor medical mall [facility] Entrance-take elevator on left to 2nd floor.  Check in with surgery information desk.) To find out your arrival time please call [phone] between 1PM - 3PM on 09/25/2019 Tues  Remember: Instructions that are not followed completely may result in serious medical risk, up to and including death, or upon the discretion of your surgeon and anesthesiologist your surgery may need to be rescheduled.    _x___ 1. Do not eat food after midnight the night before your procedure. You may drink clear liquids up to 2 hours before you are scheduled to arrive at the hospital for your procedure.  Do not drink clear liquids within 2 hours of your scheduled arrival to the hospital.  Clear liquids include  --Water or Apple juice without pulp  --Clear carbohydrate beverage such as ClearFast or Gatorade  --Black Coffee or Clear Tea (No milk, no creamers, do not add anything to                  the coffee or Tea Type 1 and type 2 diabetics should only drink water.   ____Ensure clear carbohydrate drink on the way to the hospital for bariatric patients  ____Ensure clear carbohydrate drink 3 hours before surgery.   No gum chewing or hard candies.     __x__ 2. No Alcohol for 24 hours before or after surgery.   __x__3. No Smoking or e-cigarettes for 24 prior to surgery.  Do not use any chewable tobacco products for at least 6 hour prior to surgery   ____  4. Bring all medications with you on the day of surgery if instructed.    __x__ 5. Notify your doctor if there is any change in your medical condition     (cold, fever, infections).    x___6. On the morning of surgery brush your teeth with toothpaste and water.  You may rinse your mouth with mouth wash if you wish.  Do not swallow any toothpaste or mouthwash.   Do not wear jewelry, make-up, hairpins, clips or nail polish.  Do not wear lotions,  powders, or perfumes. You may wear deodorant.  Do not shave 48 hours prior to surgery. Men may shave face and neck.  Do not bring valuables to the hospital.    [facility] is not responsible for any belongings or valuables.               Contacts, dentures or bridgework may not be worn into surgery.  Leave your suitcase in the car. After surgery it may be brought to your room.  For patients admitted to the hospital, discharge time is determined by your                       treatment team.  _  Patients discharged the day of surgery will not be allowed to drive home.  You will need someone to drive you home and stay with you the night of your procedure.    Please read over the following fact sheets that you were given:   [facility] Preparing for Surgery and or MRSA Information   _x___ Take anti-hypertensive listed below, cardiac, seizure, asthma,     anti-reflux and psychiatric medicines. These include:  1. None  2.  3.  4.  5.  6.  ____Fleets enema or Magnesium Citrate as directed.   _x___ Use CHG Soap or  sage wipes as directed on instruction sheet   ____ Use inhalers on the day of surgery and bring to hospital day of surgery  ____ Stop Metformin and Janumet 2 days prior to surgery.    ____ Take 1/2 of usual insulin dose the night before surgery and none on the morning     surgery.   _x___ Follow recommendations from Cardiologist, Pulmonologist or PCP regarding          stopping Aspirin, Coumadin, Plavix ,Eliquis, Effient, or Pradaxa, and Pletal.  X____Stop Anti-inflammatories such as Advil, Aleve, Ibuprofen, Motrin, Naproxen, Naprosyn, Goodies powders or aspirin products. OK to take Tylenol and                          Celebrex.   _x___ Stop supplements until after surgery.  But may continue Vitamin D, Vitamin B,       and multivitamin.   ____ Bring C-Pap to the hospital.

## 2019-09-21 ENCOUNTER — Other Ambulatory Visit: Payer: Medicare Other

## 2019-09-21 ENCOUNTER — Encounter
Admission: RE | Admit: 2019-09-21 | Discharge: 2019-09-21 | Disposition: A | Discharge status: Home / Self Care | Payer: Medicare Other | Source: Ambulatory Visit | Attending: General Surgery | Admitting: General Surgery

## 2019-09-21 ENCOUNTER — Other Ambulatory Visit: Payer: Self-pay

## 2019-09-21 ENCOUNTER — Encounter: Payer: Self-pay | Admitting: Internal Medicine

## 2019-09-21 DIAGNOSIS — Z01818 Encounter for other preprocedural examination: Secondary | ICD-10-CM | POA: Insufficient documentation

## 2019-09-21 DIAGNOSIS — C186 Malignant neoplasm of descending colon: Secondary | ICD-10-CM | POA: Diagnosis not present

## 2019-09-21 DIAGNOSIS — Z20828 Contact with and (suspected) exposure to other viral communicable diseases: Secondary | ICD-10-CM | POA: Insufficient documentation

## 2019-09-21 LAB — SARS CORONAVIRUS 2 (TAT 6-24 HRS): SARS Coronavirus 2: NEGATIVE

## 2019-09-21 NOTE — Progress Notes (Signed)
Called pt to prescreen her for appt on 12/21. No new concerns noted at this time.

## 2019-09-24 ENCOUNTER — Inpatient Hospital Stay (HOSPITAL_BASED_OUTPATIENT_CLINIC_OR_DEPARTMENT_OTHER): Payer: Medicare Other | Admitting: Internal Medicine

## 2019-09-24 ENCOUNTER — Encounter
Admission: RE | Admit: 2019-09-24 | Discharge: 2019-09-24 | Disposition: A | Discharge status: Home / Self Care | Payer: Medicare Other | Source: Ambulatory Visit | Attending: Internal Medicine | Admitting: Internal Medicine

## 2019-09-24 ENCOUNTER — Inpatient Hospital Stay: Payer: Medicare Other

## 2019-09-24 ENCOUNTER — Other Ambulatory Visit: Payer: Self-pay

## 2019-09-24 DIAGNOSIS — C186 Malignant neoplasm of descending colon: Secondary | ICD-10-CM | POA: Insufficient documentation

## 2019-09-24 DIAGNOSIS — Z9049 Acquired absence of other specified parts of digestive tract: Secondary | ICD-10-CM | POA: Diagnosis not present

## 2019-09-24 DIAGNOSIS — Z79899 Other long term (current) drug therapy: Secondary | ICD-10-CM | POA: Insufficient documentation

## 2019-09-24 DIAGNOSIS — R911 Solitary pulmonary nodule: Secondary | ICD-10-CM | POA: Insufficient documentation

## 2019-09-24 DIAGNOSIS — K769 Liver disease, unspecified: Secondary | ICD-10-CM | POA: Diagnosis not present

## 2019-09-24 DIAGNOSIS — K639 Disease of intestine, unspecified: Secondary | ICD-10-CM | POA: Diagnosis not present

## 2019-09-24 LAB — GLUCOSE, CAPILLARY: Glucose-Capillary: 76 mg/dL (ref 70–99)

## 2019-09-24 MED ORDER — LIDOCAINE-PRILOCAINE 2.5-2.5 % EX CREA: 1.0000 "application " | TOPICAL_CREAM | CUTANEOUS | 0 refills | Status: DC | PRN

## 2019-09-24 MED ORDER — PROCHLORPERAZINE MALEATE 10 MG PO TABS: 10.0000 mg | ORAL_TABLET | Freq: Four times a day (QID) | ORAL | 1 refills | Status: DC | PRN

## 2019-09-24 MED ORDER — FLUDEOXYGLUCOSE F - 18 (FDG) INJECTION
7.6400 | Freq: Once | INTRAVENOUS | Status: AC | PRN
  Administered 2019-09-24: 7.64 via INTRAVENOUS

## 2019-09-24 MED ORDER — ONDANSETRON HCL 8 MG PO TABS: ORAL_TABLET | ORAL | 1 refills | Status: DC

## 2019-09-24 NOTE — Assessment & Plan Note (Addendum)
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  Discussed with Dr. Tollie Pizza.  # I discussed that"neoadjuvant therapy" FOLFOX + avastin chemotherapy recommended every 2 weeks.  We will plan at least 3 months of chemotherapy; restage.  Based upon above PET scan patient will likely also need colonoscopy; and assessment MRI of the liver -with possible liver resection.  Patient status post chemotherapy education.     #Patient awaiting port placement; sent antiemetics.  Plan to start chemotherapy in the first week of January.  Above plan of care was discussed with patient and family in detail.  Discussed with Drue Dun.  # DISPOSITION:  # follow up jan 5th-MD;  FOLFOX + avastin labs today- cbc/cmp/cea-Dr.B

## 2019-09-24 NOTE — Progress Notes (Signed)
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  #   # NGS/MOLECULAR TESTS: P; MSI-STABLE.   # PALLIATIVE CARE EVALUATION: P  # PAIN MANAGEMENT: none  DIAGNOSIS: COLON CA  STAGE:   IV      ;  GOALS: control  CURRENT/MOST RECENT THERAPY : FOLFOX + Avastin [C**]    Cancer of left colon (HCC)  09/13/2019 Initial Diagnosis   Cancer of left colon ([place])   09/13/2019 -  Chemotherapy   The patient had PALONOSETRON HCL INJECTION 0.25 MG/5ML, 0.25 mg, Intravenous,  Once, 0 of 4 cycles leucovorin 672 mg in dextrose 5 % 250 mL infusion, 400 mg/m2, Intravenous,  Once, 0 of 4 cycles oxaliplatin (ELOXATIN) 145 mg in dextrose 5 % 500 mL chemo infusion, 85 mg/m2, Intravenous,  Once, 0 of 4 cycles fluorouracil (ADRUCIL) chemo injection 650 mg, 400 mg/m2, Intravenous,  Once, 0 of 4 cycles fluorouracil (ADRUCIL) 4,050 mg in sodium chloride 0.9 % 69 mL chemo infusion, 2,400 mg/m2, Intravenous, 1 Day/Dose, 0 of 4 cycles bevacizumab-bvzr (ZIRABEV) 300 mg in sodium chloride 0.9 % 100 mL chemo infusion, 5 mg/kg, Intravenous,  Once, 0 of 4 cycles  for chemotherapy treatment.       HISTORY OF PRESENTING ILLNESS:  Courtney Jefferson 78 y.o.  female left-sided colon cancer is here for follow-up/review results of the PET scan.  Patient  appetite is good.  No nausea no vomiting.  Anxious.  Appetite is improving.   Review of Systems  Constitutional: Positive for malaise/fatigue. Negative for chills, diaphoresis and fever.  HENT: Negative for nosebleeds and sore throat.   Eyes: Negative for double vision.  Respiratory: Negative for cough, hemoptysis, sputum production, shortness of breath and wheezing.   Cardiovascular: Negative for chest pain, palpitations, orthopnea and leg swelling.  Gastrointestinal: Negative for blood in stool, diarrhea, heartburn, melena, nausea and vomiting.  Genitourinary: Negative for dysuria, frequency and urgency.  Musculoskeletal: Negative for back pain and joint pain.  Skin: Negative.  Negative for itching and rash.  Neurological: Negative for dizziness, tingling, focal weakness, weakness and headaches.  Endo/Heme/Allergies: Does not bruise/bleed easily.  Psychiatric/Behavioral: Negative for depression. The patient is nervous/anxious. The patient does not have insomnia.      MEDICAL HISTORY:  Past Medical History:  Diagnosis Date  . Cancer [facility])    cancer of colon  . Hypertension   . PONV (postoperative nausea and vomiting)     SURGICAL HISTORY: Past Surgical History:  Procedure Laterality Date  . ABDOMINAL HYSTERECTOMY     partial  . COLECTOMY WITH COLOSTOMY CREATION/HARTMANN PROCEDURE  08/18/2019   Procedure: COLECTOMY WITH COLOSTOMY CREATION/HARTMANN PROCEDURE;  Surgeon: Robert Bellow, MD;  Location: [facility] ORS;  Service: General;;  . LAPAROTOMY N/A 08/18/2019   Procedure: Exploratory Laparotomy;  Surgeon: Robert Bellow, MD;  Location: [facility] ORS;  Service: General;  Laterality: N/A;    SOCIAL HISTORY: Social History   Socioeconomic History  . Marital status:  Widowed    Spouse name: Not on file  . Number of children: Not on file  . Years of education: Not on file  . Highest education level: Not on file  Occupational History  . Not on file  Tobacco Use  . Smoking  status: Former Research scientist (life sciences)  . Smokeless tobacco: Never Used  Substance and Sexual Activity  . Alcohol use: No  . Drug use: No  . Sexual activity: Not Currently    Birth control/protection: Surgical  Other Topics Concern  . Not on file  Social History Narrative   Lives in [place]; no smoking; no alcohol; [employer]. Lives by self; daughters live close by.    Social Determinants of Health   Financial Resource Strain:   . Difficulty of Paying Living Expenses: Not on file  Food Insecurity:   . Worried About Charity fundraiser in the Last Year: Not on file  . Ran Out of Food in the Last Year: Not on file  Transportation Needs:   . Lack of Transportation (Medical): Not on file  . Lack of Transportation (Non-Medical): Not on file  Physical Activity:   . Days of Exercise per Week: Not on file  . Minutes of Exercise per Session: Not on file  Stress:   . Feeling of Stress : Not on file  Social Connections:   . Frequency of Communication with Friends and Family: Not on file  . Frequency of Social Gatherings with Friends and Family: Not on file  . Attends Religious Services: Not on file  . Active Member of Clubs or Organizations: Not on file  . Attends Archivist Meetings: Not on file  . Marital Status: Not on file  Intimate Partner Violence:   . Fear of Current or Ex-Partner: Not on file  . Emotionally Abused: Not on file  . Physically Abused: Not on file  . Sexually Abused: Not on file    FAMILY HISTORY: Family History  Problem Relation Age of Onset  . Diabetes Sister   . Cancer Brother        died young    ALLERGIES:  has No Known Allergies.  MEDICATIONS:  Current Outpatient Medications  Medication Sig Dispense Refill  . acetaminophen (TYLENOL) 325 MG tablet Take 2 tablets (650 mg total) by mouth every 6 (six) hours as needed. 60 tablet 0  . Black Cohosh 40 MG CAPS Take 40 mg by mouth daily.     . calcium carbonate (OSCAL) 1500 (600 Ca) MG TABS tablet Take  600 mg of elemental calcium by mouth 2 (two) times daily with a meal.     . enoxaparin (LOVENOX) 40 MG/0.4ML injection Inject 0.4 mLs (40 mg total) into the skin daily for 12 days. (Patient not taking: Reported on 09/18/2019) 0.4 mL 0  . lidocaine-prilocaine (EMLA) cream Apply 1 application topically as needed. 30 g 0  . metoCLOPramide (REGLAN) 5 MG tablet Take 1 tablet (5 mg total) by mouth 4 (four) times daily -  before meals and at bedtime. (Patient not taking: Reported on 09/13/2019) 30 tablet 0  . ondansetron (ZOFRAN) 8 MG tablet One pill every 8 hours as needed for nausea/vomitting. 40 tablet 1  . prochlorperazine (COMPAZINE) 10 MG tablet Take 1 tablet (10 mg total) by mouth every 6 (six) hours as needed for nausea or vomiting. 40 tablet 1   No current facility-administered medications for this visit.      Marland Kitchen  PHYSICAL EXAMINATION: ECOG PERFORMANCE STATUS: 1 - Symptomatic but  completely ambulatory  Vitals:   09/24/19 1448  BP: (!) 165/91  Pulse: 75  Temp: (!) 97.1 F (36.2 C)   Filed Weights   09/24/19 1448  Weight: 136 lb 12.8 oz (62.1 kg)    Physical Exam  Constitutional: She is oriented to person, place, and time and well-developed, well-nourished, and in no distress.  Tearful.  Accompanied by daughter.  HENT:  Head: Normocephalic and atraumatic.  Mouth/Throat: Oropharynx is clear and moist. No oropharyngeal exudate.  Eyes: Pupils are equal, round, and reactive to light.  Cardiovascular: Normal rate and regular rhythm.  Pulmonary/Chest: No respiratory distress. She has no wheezes.  Abdominal: Soft. Bowel sounds are normal. She exhibits no distension and no mass. There is no abdominal tenderness. There is no rebound and no guarding.  Musculoskeletal:        General: No tenderness or edema. Normal range of motion.     Cervical back: Normal range of motion and neck supple.  Neurological: She is alert and oriented to person, place, and time.  Skin: Skin is warm.   Psychiatric: Affect normal.     LABORATORY DATA:  I have reviewed the data as listed Lab Results  Component Value Date   WBC 6.5 09/13/2019   HGB 11.4 (L) 09/13/2019   HCT 36.8 09/13/2019   MCV 94.6 09/13/2019   PLT 260 09/13/2019   Recent Labs    08/18/19 0711 08/25/19 1340 08/27/19 1022 09/13/19 1148  NA 136 142 140 139  K 2.7* 3.8 3.6 3.8  CL 105 102 107 107  CO2 '22 29 23 24  ' GLUCOSE 126* 128* 105* 93  BUN '14 17 12 13  ' CREATININE 0.68 0.81 0.73 0.69  CALCIUM 8.3* 9.7 8.6* 9.3  GFRNONAA >60 >60 >60 >60  GFRAA >60 >60 >60 >60  PROT 7.2 7.0  --  7.1  ALBUMIN 3.5 3.0*  --  3.6  AST 14* 23  --  14*  ALT 13 28  --  17  ALKPHOS 72 66  --  49  BILITOT 0.8 0.7  --  0.4    RADIOGRAPHIC STUDIES: I have personally reviewed the radiological images as listed and agreed with the findings in the report. DG SMALL BOWEL W SINGLE CM (SOL OR THIN BA)  Result Date: 08/28/2019 CLINICAL DATA:  Question small-bowel obstruction. NG tube in place. Prior recent colectomy and colostomy creation. EXAM: SMALL BOWEL SERIES COMPARISON:  No prior. TECHNIQUE: Diluted Omnipaque administered to the patient through her NG tube. Serial imaging was obtained of the abdomen. FLUOROSCOPY TIME:  Fluoroscopy Time:  2 minutes 6 seconds Radiation Exposure Index (if provided by the fluoroscopic device): 49.0 mGy FINDINGS: Dilute Omnipaque administered into the patient's NG tube and serial images obtained. NG tube noted with tip in the stomach. The stomach appeared slightly a tiny. Mild gastroesophageal reflux noted. Small bowel appears normal. No evidence of small-bowel obstruction. Small-bowel empties right rapidly into the right colon and ostomy bag. IMPRESSION: 1. NG tube is noted with tip in stomach. Mild gastric at cannot be excluded. Mild gastroesophageal reflux noted. 2. Small bowel appears normal. No evidence of small-bowel obstruction. Electronically Signed   By: Marcello Moores  Register   On: 08/28/2019 11:51    NM PET Image Initial (PI) Skull Base To Thigh  Result Date: 09/24/2019 CLINICAL DATA:  Subsequent treatment strategy for colorectal carcinoma. Hemicolectomy November 11/20. EXAM: NUCLEAR MEDICINE PET SKULL BASE TO THIGH TECHNIQUE: 7.6 mCi F-18 FDG was injected intravenously. Full-ring PET imaging was performed from  the skull base to thigh after the radiotracer. CT data was obtained and used for attenuation correction and anatomic localization. Fasting blood glucose: 76 mg/dl COMPARISON:  Diagnostic CT scan 08/17/2019 FINDINGS: Mediastinal blood pool activity: SUV max 2.1 Liver activity: SUV max NA NECK: No hypermetabolic lymph nodes in the neck. Incidental CT findings: none CHEST: 3 mm nodule in the LEFT upper lobe (image 65/7) without metabolic activity. No hypermetabolic mediastinal lymph nodes. Incidental CT findings: none ABDOMEN/PELVIS: A single focus hypermetabolic activity is present in the LEFT lateral hepatic lobe. This lesion is not identified on the noncontrast CT. Lesion is relatively small measuring approximately 1.5 cm with SUV max equal 4.7 (image 167). The this does correspond to a hepatic lesion on diagnostic scan from levin 13 20. No hypermetabolic lymph nodes in porta hepatis or retroperitoneum. No hypermetabolic pelvic lymph nodes. There is a hypermetabolic nodule within the mesentery of the LEFT lower quadrant measuring 1.6 cm (image 13182/3) with SUV max equal 9.1. A focus of intense metabolic activity is present within distal sigmoid colon with SUV max equal 20.5. There is a subtle ovoid lesion within the wall of colon wall at this level measuring 14 mm 10 mm (image 217/3) Incidental CT findings: LEFT lower quadrant colostomy. Calcified body in the LEFT upper quadrant. SKELETON: No focal hypermetabolic activity to suggest skeletal metastasis. Incidental CT findings: none IMPRESSION: 1. Hypermetabolic lesion in the LEFT hepatic lobe corresponds to lesion on comparison contrast CT.  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 2. Interval partial LEFT hemicolectomy. 3. Hypermetabolic metastatic lymph node within the descending colon mesentery. 4. Lesion within the wall of the proximal sigmoid colon concerning for neoplasm. 5. Small pulmonary nodule without metabolic activity is favored benign. Electronically Signed   By: Suzy Bouchard M.D.   On: 09/24/2019 15:39   DG Abd 2 Views  Result Date: 08/27/2019 CLINICAL DATA:  78 year old female with small bowel obstruction. NG placement. EXAM: ABDOMEN - 2 VIEW COMPARISON:  Radiograph dated 08/25/2019. FINDINGS: Enteric tube with tip and side-port in the stomach. No bowel dilatation identified. Air is noted in the descending colon. 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 The osseous structures are intact. Midline anterior abdominal skin staples noted. IMPRESSION: 1. Enteric tube with tip and side-port in the stomach. 2. No bowel dilatation. Electronically Signed   By: Anner Crete M.D.   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  Discussed with Dr. Tollie Pizza.  # I discussed that"neoadjuvant therapy" FOLFOX + avastin chemotherapy recommended every 2 weeks.  We will plan at least 3 months of chemotherapy; restage.  Based upon above PET scan patient will likely also need colonoscopy; and assessment MRI of the liver -with possible liver resection.  Patient status post chemotherapy education.     #Patient awaiting port placement; sent antiemetics.  Plan to start chemotherapy in the first week of January.  Above plan of care was discussed with patient and family in  detail.  Discussed with Drue Dun.  # DISPOSITION:  # follow up jan 5th-MD;  FOLFOX + avastin labs today- cbc/cmp/cea-Dr.B  All questions were answered. The patient knows to call the clinic with any problems, questions or concerns.  Cammie Sickle, MD 09/25/2019 7:15 AM

## 2019-09-26 ENCOUNTER — Encounter: Payer: Self-pay | Admitting: General Surgery

## 2019-09-26 ENCOUNTER — Ambulatory Visit: Payer: Medicare Other | Admitting: Anesthesiology

## 2019-09-26 ENCOUNTER — Ambulatory Visit: Payer: Medicare Other

## 2019-09-26 ENCOUNTER — Encounter
Admission: RE | Disposition: A | Discharge status: Home / Self Care | Payer: Self-pay | Source: Home / Self Care | Attending: General Surgery

## 2019-09-26 ENCOUNTER — Other Ambulatory Visit: Payer: Self-pay

## 2019-09-26 ENCOUNTER — Ambulatory Visit
Admission: RE | Admit: 2019-09-26 | Discharge: 2019-09-26 | Disposition: A | Discharge status: Home / Self Care | Payer: Medicare Other | Attending: General Surgery | Admitting: General Surgery

## 2019-09-26 DIAGNOSIS — I10 Essential (primary) hypertension: Secondary | ICD-10-CM | POA: Diagnosis not present

## 2019-09-26 DIAGNOSIS — Z9049 Acquired absence of other specified parts of digestive tract: Secondary | ICD-10-CM | POA: Diagnosis not present

## 2019-09-26 DIAGNOSIS — Z87891 Personal history of nicotine dependence: Secondary | ICD-10-CM | POA: Insufficient documentation

## 2019-09-26 DIAGNOSIS — Z933 Colostomy status: Secondary | ICD-10-CM | POA: Insufficient documentation

## 2019-09-26 DIAGNOSIS — C772 Secondary and unspecified malignant neoplasm of intra-abdominal lymph nodes: Secondary | ICD-10-CM | POA: Insufficient documentation

## 2019-09-26 DIAGNOSIS — Z95828 Presence of other vascular implants and grafts: Secondary | ICD-10-CM

## 2019-09-26 DIAGNOSIS — C189 Malignant neoplasm of colon, unspecified: Secondary | ICD-10-CM | POA: Insufficient documentation

## 2019-09-26 HISTORY — PX: PORTACATH PLACEMENT: SHX2246

## 2019-09-26 SURGERY — INSERTION, TUNNELED CENTRAL VENOUS DEVICE, WITH PORT
Anesthesia: General

## 2019-09-26 MED ORDER — FAMOTIDINE 20 MG PO TABS
ORAL_TABLET | ORAL | Status: AC
  Administered 2019-09-26: 20 mg via ORAL
  Filled 2019-09-26: qty 1

## 2019-09-26 MED ORDER — PROPOFOL 10 MG/ML IV BOLUS
INTRAVENOUS | Status: DC | PRN
  Administered 2019-09-26: 60 mg via INTRAVENOUS

## 2019-09-26 MED ORDER — PROPOFOL 500 MG/50ML IV EMUL
INTRAVENOUS | Status: DC | PRN
  Administered 2019-09-26: 100 ug/kg/min via INTRAVENOUS

## 2019-09-26 MED ORDER — SODIUM CHLORIDE (PF) 0.9 % IJ SOLN
INTRAMUSCULAR | Status: AC
  Filled 2019-09-26: qty 50

## 2019-09-26 MED ORDER — FENTANYL CITRATE (PF) 100 MCG/2ML IJ SOLN: 25.0000 ug | INTRAMUSCULAR | Status: DC | PRN

## 2019-09-26 MED ORDER — HEPARIN SODIUM (PORCINE) 5000 UNIT/ML IJ SOLN
INTRAMUSCULAR | Status: AC
  Filled 2019-09-26: qty 1

## 2019-09-26 MED ORDER — LIDOCAINE HCL 1 % IJ SOLN
INTRAMUSCULAR | Status: DC | PRN
  Administered 2019-09-26: 10 mL

## 2019-09-26 MED ORDER — FENTANYL CITRATE (PF) 100 MCG/2ML IJ SOLN
INTRAMUSCULAR | Status: DC | PRN
  Administered 2019-09-26 (×3): 25 ug via INTRAVENOUS

## 2019-09-26 MED ORDER — CEFAZOLIN SODIUM-DEXTROSE 2-4 GM/100ML-% IV SOLN
INTRAVENOUS | Status: AC
  Filled 2019-09-26: qty 100

## 2019-09-26 MED ORDER — ONDANSETRON HCL 4 MG/2ML IJ SOLN
INTRAMUSCULAR | Status: DC | PRN
  Administered 2019-09-26: 4 mg via INTRAVENOUS

## 2019-09-26 MED ORDER — DEXAMETHASONE SODIUM PHOSPHATE 10 MG/ML IJ SOLN
INTRAMUSCULAR | Status: DC | PRN
  Administered 2019-09-26: 10 mg via INTRAVENOUS

## 2019-09-26 MED ORDER — CEFAZOLIN SODIUM-DEXTROSE 2-4 GM/100ML-% IV SOLN
2.0000 g | INTRAVENOUS | Status: AC
  Administered 2019-09-26: 12:00:00 2 g via INTRAVENOUS

## 2019-09-26 MED ORDER — LIDOCAINE HCL (CARDIAC) PF 100 MG/5ML IV SOSY
PREFILLED_SYRINGE | INTRAVENOUS | Status: DC | PRN
  Administered 2019-09-26: 100 mg via INTRAVENOUS

## 2019-09-26 MED ORDER — LIDOCAINE HCL (PF) 1 % IJ SOLN
INTRAMUSCULAR | Status: AC
  Filled 2019-09-26: qty 30

## 2019-09-26 MED ORDER — DEXAMETHASONE SODIUM PHOSPHATE 10 MG/ML IJ SOLN
INTRAMUSCULAR | Status: AC
  Filled 2019-09-26: qty 1

## 2019-09-26 MED ORDER — SUCCINYLCHOLINE CHLORIDE 20 MG/ML IJ SOLN
INTRAMUSCULAR | Status: AC
  Filled 2019-09-26: qty 1

## 2019-09-26 MED ORDER — ONDANSETRON HCL 4 MG/2ML IJ SOLN: 4.0000 mg | Freq: Once | INTRAMUSCULAR | Status: DC | PRN

## 2019-09-26 MED ORDER — FENTANYL CITRATE (PF) 100 MCG/2ML IJ SOLN
INTRAMUSCULAR | Status: AC
  Filled 2019-09-26: qty 2

## 2019-09-26 MED ORDER — LACTATED RINGERS IV SOLN: INTRAVENOUS | Status: DC

## 2019-09-26 MED ORDER — FAMOTIDINE 20 MG PO TABS: 20.0000 mg | ORAL_TABLET | Freq: Once | ORAL | Status: AC

## 2019-09-26 MED ORDER — ONDANSETRON HCL 4 MG/2ML IJ SOLN
INTRAMUSCULAR | Status: AC
  Filled 2019-09-26: qty 2

## 2019-09-26 MED ORDER — LIDOCAINE HCL URETHRAL/MUCOSAL 2 % EX GEL
CUTANEOUS | Status: AC
  Filled 2019-09-26: qty 5

## 2019-09-26 MED ORDER — PROPOFOL 500 MG/50ML IV EMUL
INTRAVENOUS | Status: AC
  Filled 2019-09-26: qty 50

## 2019-09-26 MED ORDER — SODIUM CHLORIDE 0.9 % IV SOLN
INTRAVENOUS | Status: DC | PRN
  Administered 2019-09-26: 50 mL via INTRAMUSCULAR

## 2019-09-26 MED ORDER — BUPIVACAINE-EPINEPHRINE (PF) 0.25% -1:200000 IJ SOLN
INTRAMUSCULAR | Status: AC
  Filled 2019-09-26: qty 30

## 2019-09-26 SURGICAL SUPPLY — 41 items
BAG DECANTER STRL (MISCELLANEOUS) ×2 IMPLANT
BLADE SURG 11 STRL SS SAFETY (MISCELLANEOUS) ×2 IMPLANT
BLADE SURG SZ11 CARB STEEL (BLADE) ×3 IMPLANT
BOOT SUTURE AID YELLOW STND (SUTURE) ×3 IMPLANT
CANISTER SUCT 1200ML W/VALVE (MISCELLANEOUS) ×3 IMPLANT
CHLORAPREP W/TINT 26 (MISCELLANEOUS) ×3 IMPLANT
CLOSURE WOUND 1/2 X4 (GAUZE/BANDAGES/DRESSINGS) ×1
COVER LIGHT HANDLE STERIS (MISCELLANEOUS) ×6 IMPLANT
COVER WAND RF STERILE (DRAPES) ×3 IMPLANT
DERMABOND ADVANCED (GAUZE/BANDAGES/DRESSINGS)
DERMABOND ADVANCED .7 DNX12 (GAUZE/BANDAGES/DRESSINGS) ×1 IMPLANT
DRAPE C-ARM XRAY 36X54 (DRAPES) ×3 IMPLANT
DRSG TEGADERM 2-3/8X2-3/4 SM (GAUZE/BANDAGES/DRESSINGS) ×2 IMPLANT
DRSG TEGADERM 4X4.75 (GAUZE/BANDAGES/DRESSINGS) ×2 IMPLANT
DRSG TELFA 3X8 NADH (GAUZE/BANDAGES/DRESSINGS) ×3 IMPLANT
ELECT REM PT RETURN 9FT ADLT (ELECTROSURGICAL) ×3
ELECTRODE REM PT RTRN 9FT ADLT (ELECTROSURGICAL) ×1 IMPLANT
GLOVE BIO SURGEON STRL SZ 6.5 (GLOVE) ×2 IMPLANT
GLOVE BIO SURGEONS STRL SZ 6.5 (GLOVE) ×1
GLOVE BIOGEL PI IND STRL 6.5 (GLOVE) ×1 IMPLANT
GLOVE BIOGEL PI INDICATOR 6.5 (GLOVE) ×2
GOWN STRL REUS W/ TWL LRG LVL3 (GOWN DISPOSABLE) ×3 IMPLANT
GOWN STRL REUS W/TWL LRG LVL3 (GOWN DISPOSABLE) ×6
KIT PORT POWER 8FR ISP CVUE (Port) ×3 IMPLANT
KIT TURNOVER KIT A (KITS) ×3 IMPLANT
LABEL OR SOLS (LABEL) ×3 IMPLANT
NDL FILTER BLUNT 18X1 1/2 (NEEDLE) ×1 IMPLANT
NEEDLE FILTER BLUNT 18X 1/2SAF (NEEDLE) ×2
NEEDLE FILTER BLUNT 18X1 1/2 (NEEDLE) ×1 IMPLANT
PACK PORT-A-CATH (MISCELLANEOUS) ×3 IMPLANT
PAD DRESSING TELFA 3X8 NADH (GAUZE/BANDAGES/DRESSINGS) IMPLANT
SCALPEL PROTECTED #11 DISP (BLADE) IMPLANT
STRIP CLOSURE SKIN 1/2X4 (GAUZE/BANDAGES/DRESSINGS) ×1 IMPLANT
SUT MNCRL AB 4-0 PS2 18 (SUTURE) ×3 IMPLANT
SUT PROLENE 2 0 FS (SUTURE) ×3 IMPLANT
SUT VIC AB 2-0 SH 27 (SUTURE) ×2
SUT VIC AB 2-0 SH 27XBRD (SUTURE) ×1 IMPLANT
SUT VIC AB 3-0 SH 27 (SUTURE) ×2
SUT VIC AB 3-0 SH 27X BRD (SUTURE) ×1 IMPLANT
SYR 10ML LL (SYRINGE) ×3 IMPLANT
SYR 3ML LL SCALE MARK (SYRINGE) ×5 IMPLANT

## 2019-09-26 NOTE — Anesthesia Post-op Follow-up Note (Signed)
Anesthesia QCDR form completed.        

## 2019-09-26 NOTE — Op Note (Signed)
SURGICAL PROCEDURE REPORT  DATE OF PROCEDURE: 09/26/2019   SURGEON: Dr. Windell Moment   ASSISTANT SURGEON: Dr. Bary Castilla  ANESTHESIA: Local with light IV sedation   PRE-OPERATIVE DIAGNOSIS: Metastatic colon cancer requiring durable central venous access for chemotherapy   POST-OPERATIVE DIAGNOSIS: Metastatic colon cancer requiring durable central venous access for chemotherapy  PROCEDURE(S):  1.) Percutaneous access of Left subclavian vein under ultrasound guidance 2.) Insertion of tunneled Left subclavian central venous catheter with subcutaneous port  INTRAOPERATIVE FINDINGS: Patent easily identified Left subclavian vein with appropriate respiratory variations and well-secured tunneled central venous catheter with subcutaneous port at completion of the procedure  ESTIMATED BLOOD LOSS: Minimal  COMPLICATIONS: None apparent   CONDITION AT COMPLETION: Hemodynamically stable, awake   DISPOSITION: PACU   INDICATION(S) FOR PROCEDURE:  Patient is a 78 y.o. female who presented with metastatic colon cancer requiring durable central venous access for chemotherapy. All risks, benefits, and alternatives to above elective procedures were discussed with the patient, who elected to proceed, and informed consent was accordingly obtained at that time.  DETAILS OF PROCEDURE:  Patient was brought to the operative suite and appropriately identified. In Trendelenburg position, Left subclavian venous access site was prepped and draped in the usual sterile fashion, and following a brief timeout, percutaneous Left subclavian venous access was obtained under ultrasound guidance using Seldinger technique, by which local anesthetic was injected over the Left subclavian vein, and access needle was inserted under direct ultrasound visualization into the Left subclavian vein, through which soft guidewire was advanced, over which access needle was withdrawn. Guidewire was secured, attention was directed to  injection of local anesthetic along the planned tunnel site, on left mid chest. Left chest incision was made and confirmed to accommodate the subcutaneous port, and flushed catheter was tunneled retrograde from the port site over the Left chest to the Left subclavian access site with the attached port well-secured to the catheter and within the subcutaneous pocket. Insertion sheath was advanced over the guidewire, which was withdrawn along with the insertion sheath dilator. The catheter was introduced through the sheath and left on the [place] junction under fluoro guidance and catheter cut to desire lenght. Catheter connected to port and fixed to the pocket on two side to avoid twisting. Port was confirmed to withdraw blood and flush easily, after which concentrated heparin was instilled into the port and catheter. Dermis at the subcutaneous pocket was re-approximated using buried 4-0 Vicryl suture, and 4-0 Monocryl suture was used to re-approximate skin at the subcutaneous port sites in running subcuticular fashion for the subcutaneous port and buried interrupted fashion for the insertion site. Skin was cleaned, dried, and sterile strip was applied. Patient was then safely transferred to PACU for a chest x-ray. Ultrasound images are available on paper chart and Fluoroscopy guidance images are available in Epic.

## 2019-09-26 NOTE — Transfer of Care (Signed)
Immediate Anesthesia Transfer of Care Note  Patient: Courtney Jefferson  Procedure(s) Performed: INSERTION PORT-A-CATH (N/A )  Patient Location: PACU  Anesthesia Type:General  Level of Consciousness: awake  Airway & Oxygen Therapy: Patient connected to face mask oxygen  Post-op Assessment: Post -op Vital signs reviewed and stable  Post vital signs: stable  Last Vitals:  Vitals Value Taken Time  BP 139/84 09/26/19 1224  Temp    Pulse 85 09/26/19 1226  Resp 17 09/26/19 1226  SpO2 100 % 09/26/19 1226  Vitals shown include unvalidated device data.  Last Pain:  Vitals:   09/26/19 1112  TempSrc: Temporal  PainSc: 0-No pain         Complications: No apparent anesthesia complications

## 2019-09-26 NOTE — Discharge Instructions (Signed)

## 2019-09-26 NOTE — Anesthesia Preprocedure Evaluation (Signed)
Anesthesia Evaluation  Patient identified by MRN, date of birth, ID band Patient awake    Reviewed: Allergy & Precautions, NPO status , Patient's Chart, lab work & pertinent test results  History of Anesthesia Complications (+) PONV and history of anesthetic complications  Airway Mallampati: II       Dental   Pulmonary neg sleep apnea, neg COPD, Not current smoker, former smoker,           Cardiovascular (-) hypertension(-) Past MI and (-) CHF (-) dysrhythmias (-) Valvular Problems/Murmurs     Neuro/Psych neg Seizures    GI/Hepatic Neg liver ROS, neg GERD  ,  Endo/Other  neg diabetes  Renal/GU negative Renal ROS     Musculoskeletal   Abdominal   Peds  Hematology   Anesthesia Other Findings   Reproductive/Obstetrics                             Anesthesia Physical Anesthesia Plan  ASA: II  Anesthesia Plan: General   Post-op Pain Management:    Induction: Intravenous  PONV Risk Score and Plan: 4 or greater and Propofol infusion, TIVA, Treatment may vary due to age or medical condition and Ondansetron  Airway Management Planned:   Additional Equipment:   Intra-op Plan:   Post-operative Plan:   Informed Consent: I have reviewed the patients History and Physical, chart, labs and discussed the procedure including the risks, benefits and alternatives for the proposed anesthesia with the patient or authorized representative who has indicated his/her understanding and acceptance.       Plan Discussed with:   Anesthesia Plan Comments:         Anesthesia Quick Evaluation

## 2019-09-26 NOTE — H&P (Signed)
History of Present Illness Courtney Jefferson is a 78 y.o. female with colon cancer.  Patient has surgery for colon cancer on August 18, 2019.  She presented with obstructive mass of the sigmoid colon.  She had left colon resection with end colostomy and [facility] Nutritional therapist.  Pathology confirmed adenocarcinoma of the colon with 5 nodes positive for malignancy.  She was evaluated by oncology and adjuvant chemotherapy was recommended.  Patient present today for insertion of Port-A-Cath placement.  Patient denies chest pain or shortness of breath.  Past Medical History Past Medical History:  Diagnosis Date  . Cancer [facility])    cancer of colon  . Hypertension   . PONV (postoperative nausea and vomiting)        Past Surgical History:  Procedure Laterality Date  . ABDOMINAL HYSTERECTOMY     partial  . COLECTOMY WITH COLOSTOMY CREATION/HARTMANN PROCEDURE  08/18/2019   Procedure: COLECTOMY WITH COLOSTOMY CREATION/HARTMANN PROCEDURE;  Surgeon: Robert Bellow, MD;  Location: [facility] ORS;  Service: General;;  . LAPAROTOMY N/A 08/18/2019   Procedure: Exploratory Laparotomy;  Surgeon: Robert Bellow, MD;  Location: [facility] ORS;  Service: General;  Laterality: N/A;    No Known Allergies  Current Facility-Administered Medications  Medication Dose Route Frequency Provider Last Rate Last Admin  . ceFAZolin (ANCEF) 2-4 GM/100ML-% IVPB           . ceFAZolin (ANCEF) IVPB 2g/100 mL premix  2 g Intravenous On Call to [place], Forest Gleason, MD      . lactated ringers infusion   Intravenous Continuous Lubertha Basque Brett Canales, MD        Family History Family History  Problem Relation Age of Onset  . Diabetes Sister   . Cancer Brother        died young       Social History Social History   Tobacco Use  . Smoking status: Former Research scientist (life sciences)  . Smokeless tobacco: Never Used  Substance Use Topics  . Alcohol use: No  . Drug use: No      ROS Full ROS of systems performed and is otherwise negative  there than what is stated in the HPI  Physical Exam There were no vitals taken for this visit.  CONSTITUTIONAL: Alert, cooperative. NECK: Supple, no JVD. GI: The abdomen is soft, nontender, and nondistended.  Colostomy pink and patent. MUSCULOSKELETAL:  Normal muscle strength and tone in all four extremities.    SKIN: Skin turgor is normal. There are no pathologic skin lesions.  NEUROLOGIC:  Motor and sensation is grossly normal.  Cranial nerves are grossly intact. PSYCH:  Alert and oriented to person, place and time. Affect is normal.  Data Reviewed Surgical pathology review showing adenocarcinoma of the descending colon.  5 out of 26 lymph node positive for metastatic disease  I have personally reviewed the patient's imaging and medical records.    Assessment    Patient with colon cancer s/p partial colectomy with end colostomy. Colon cancer is pT4 pN2 M1 (stage IV)  Today confrontational Port-A-Cath.  Patient oriented about procedure.  Patient oriented about the benefit of the Port-A-Cath.  Patient also oriented about the risks of surgery that includes pneumothorax, hemothorax, clots in her veins, bleeding, infection, among others.  Plan    Insertion of Port-A-Cath under fluoroscopy and ultrasound-guided.  Courtney Jefferson 09/26/2019, 11:05 AM

## 2019-09-26 NOTE — Anesthesia Postprocedure Evaluation (Signed)
Anesthesia Post Note  Patient: Courtney Jefferson  Procedure(s) Performed: INSERTION PORT-A-CATH (N/A )  Patient location during evaluation: PACU Anesthesia Type: General Level of consciousness: awake and alert Pain management: pain level controlled Vital Signs Assessment: post-procedure vital signs reviewed and stable Respiratory status: spontaneous breathing and respiratory function stable Cardiovascular status: stable Anesthetic complications: no     Last Vitals:  Vitals:   09/26/19 1326 09/26/19 1344  BP: (!) 177/84 (!) 189/79  Pulse: 69 65  Resp: 18 18  Temp: 36.4 C   SpO2: 100% 99%    Last Pain:  Vitals:   09/26/19 1344  TempSrc:   PainSc: 0-No pain                 Yue Glasheen K

## 2019-10-01 ENCOUNTER — Encounter: Payer: Self-pay | Admitting: Nurse Practitioner

## 2019-10-01 NOTE — Patient Instructions (Addendum)
Colorectal Cancer  Colorectal cancer is an abnormal growth of cells and tissue (tumor) in the colon or rectum, which are parts of the large intestine. The cancer can spread (metastasize) to other parts of the body. What are the causes? Most cases of colorectal cancer start as abnormal growths called polyps on the inner wall of the colon or rectum. Other times, abnormal changes to genes (genetic mutations) can cause cells to form cancer. What increases the risk? You are more likely to develop this condition if:  You are older than age 50.  You have multiple polyps in the colon or rectum.  You have diabetes.  You are African American.  You have a family history of Lynch syndrome.  You have had cancer before.  You have certain hereditary conditions, such as: ? Familial adenomatous polyposis. ? Turcot syndrome. ? Peutz-Jeghers syndrome.  You eat a diet that is high in fat (especially animal fat) and low in fiber, fruits, and vegetables.  You have an inactive (sedentary) lifestyle.  You have an inflammatory bowel disease or Crohn's disease.  You smoke.  You drink alcohol excessively. What are the signs or symptoms? Early colorectal cancer often does not cause symptoms. As the cancer grows, symptoms may include:  Changes in bowel habits.  Feeling like the bowel does not empty completely after a bowel movement.  Stools that are narrower than usual.  Blood in the stool.  Diarrhea.  Constipation.  Anemia.  Discomfort, pain, bloating, fullness, or cramps in the abdomen.  Frequent gas pain.  Unexplained weight loss.  Constant fatigue.  Nausea and vomiting. How is this diagnosed? This condition may be diagnosed with:  A medical history.  A physical exam.  Tests. These may include: ? A digital rectal exam. ? A stool test called a fecal occult blood test. ? Blood tests. ? A test in which a tissue sample is taken from the colon or rectum and examined under a  microscope (biopsy). ? Imaging tests, such as:  X-rays.  A barium enema.  CT scans.  MRIs.  A sigmoidoscopy. This test is done to view the inside of the rectum.  A colonoscopy. This test is done to view the inside of the colon. During this test, small polyps can be removed or biopsies may be taken.  An endorectal ultrasound. This test checks how deep a tumor in the rectum has grown and whether the cancer has spread to lymph nodes or other nearby tissues. Your health care provider may order additional tests to find out whether the cancer has spread to other parts of the body (what stage it is). The stages of cancer include:  Stage 0. At this stage, the cancer is found only in the innermost lining of the colon or rectum.  Stage I. At this stage, the cancer has grown into the inner wall of the colon or rectum.  Stage II. At this stage, the cancer has gone more deeply into the wall of the colon or rectum or through the wall. It may have invaded nearby tissue.  Stage III. At this stage, the cancer has spread to nearby lymph nodes.  Stage IV. At this stage, the cancer has spread to other parts of the body, such as the liver or lungs. How is this treated? Treatment for this condition depends on the type and stage of the cancer. Treatment may include:  Surgery. In the early stages of the cancer, surgery may be done to remove polyps or small tumors from the   colon. In later stages, surgery may be done to remove part of the colon.  Chemotherapy. This treatment uses medicines to kill cancer cells.  Targeted therapy. This treatment targets specific gene mutations or proteins that the cancer expresses in order to kill tumor cells.  Radiation therapy. This treatment uses radiation to kill cancer cells or shrink tumors.  Radiofrequency ablation. This treatment uses radio waves to destroy the tumors that may have spread to other areas of the body, such as the liver. Follow these instructions at  home:  Take over-the-counter and prescription medicines only as told by your health care provider.  Try to eat regular, healthy meals. Some of your treatments might affect your appetite. If you are having problems eating or with your appetite, ask to meet with a food and nutrition specialist (dietitian).  Consider joining a support group. This may help you learn about your diagnosis and cope with the stress of having colorectal cancer.  If you are admitted to the hospital, inform your cancer care team.  Keep all follow-up visits as told by your health care provider. This is important. How is this prevented?  Colorectal cancer can be prevented with screening tests that find polyps so they can be removed before they develop into cancer.  All adults should have screening for colorectal cancer starting at age 43 and continuing until age 13. Your health care provider may recommend screening at age 60. People at increased risk should start screening at an earlier age. Where to find more information  American Cancer Society: https://www.cancer.[place] ([place]): https://www.cancer.gov Contact a health care provider if:  Your diarrhea or constipation does not go away.  You have blood in your stool.  Your bowel habits change.  You have increased pain in your abdomen.  You notice new fatigue or weakness.  You lose weight. Get help right away if:  You have increased bleeding from your rectum.  You have any uncontrollable or severe abdomen (abdominal) symptoms. Summary  Colorectal cancer is an abnormal growth of cells and tissue (tumor) in the colon or rectum.  Common risk factors for this condition include having a relative with colon cancer, being older in age, having an inflammatory bowel disease, and being African American.  This condition may be diagnosed with tests, such as a colonoscopy and biopsy.  Treatment depends on the type and stage of the cancer.  Commonly, treatment includes surgery to remove the tumor along with chemotherapy or targeted therapy.  Keep all follow-up visits as told by your health care provider. This is important. This information is not intended to replace advice given to you by your health care provider. Make sure you discuss any questions you have with your health care provider. Document Released: 09/20/2005 Document Revised: 11/10/2017 Document Reviewed: 10/22/2016 Elsevier Patient Education  2020 [employer].

## 2019-10-04 ENCOUNTER — Encounter: Payer: Self-pay | Admitting: Internal Medicine

## 2019-10-08 NOTE — Progress Notes (Signed)
Patient is coming in for new start treatment no pain and no to all covid screening questions

## 2019-10-09 ENCOUNTER — Inpatient Hospital Stay: Payer: Medicare Other | Attending: Internal Medicine

## 2019-10-09 ENCOUNTER — Inpatient Hospital Stay (HOSPITAL_BASED_OUTPATIENT_CLINIC_OR_DEPARTMENT_OTHER): Payer: Medicare Other | Admitting: Internal Medicine

## 2019-10-09 ENCOUNTER — Other Ambulatory Visit: Payer: Self-pay

## 2019-10-09 ENCOUNTER — Inpatient Hospital Stay: Payer: Medicare Other

## 2019-10-09 VITALS — BP 137/75 | HR 82 | Temp 99.1°F | Wt 135.0 lb

## 2019-10-09 DIAGNOSIS — C186 Malignant neoplasm of descending colon: Secondary | ICD-10-CM | POA: Diagnosis present

## 2019-10-09 DIAGNOSIS — K769 Liver disease, unspecified: Secondary | ICD-10-CM | POA: Insufficient documentation

## 2019-10-09 DIAGNOSIS — R634 Abnormal weight loss: Secondary | ICD-10-CM

## 2019-10-09 DIAGNOSIS — Z452 Encounter for adjustment and management of vascular access device: Secondary | ICD-10-CM | POA: Insufficient documentation

## 2019-10-09 DIAGNOSIS — Z5111 Encounter for antineoplastic chemotherapy: Secondary | ICD-10-CM | POA: Diagnosis present

## 2019-10-09 LAB — COMPREHENSIVE METABOLIC PANEL
ALT: 26 U/L (ref 0–44)
AST: 28 U/L (ref 15–41)
Albumin: 3.7 g/dL (ref 3.5–5.0)
Alkaline Phosphatase: 57 U/L (ref 38–126)
Anion gap: 11 (ref 5–15)
BUN: 10 mg/dL (ref 8–23)
CO2: 19 mmol/L — ABNORMAL LOW (ref 22–32)
Calcium: 8.8 mg/dL — ABNORMAL LOW (ref 8.9–10.3)
Chloride: 104 mmol/L (ref 98–111)
Creatinine, Ser: 0.68 mg/dL (ref 0.44–1.00)
GFR calc Af Amer: >60 mL/min (ref >60)
GFR calc non Af Amer: >60 mL/min (ref >60)
Glucose, Bld: 112 mg/dL — ABNORMAL HIGH (ref 70–99)
Potassium: 3.2 mmol/L — ABNORMAL LOW (ref 3.5–5.1)
Sodium: 134 mmol/L — ABNORMAL LOW (ref 135–145)
Total Bilirubin: 0.5 mg/dL (ref 0.3–1.2)
Total Protein: 7.2 g/dL (ref 6.5–8.1)

## 2019-10-09 LAB — CBC WITH DIFFERENTIAL/PLATELET
Abs Immature Granulocytes: 0.02 10*3/uL (ref 0.00–0.07)
Basophils Absolute: 0 10*3/uL (ref 0.0–0.1)
Basophils Relative: 0 %
Eosinophils Absolute: 0 10*3/uL (ref 0.0–0.5)
Eosinophils Relative: 0 %
HCT: 37 % (ref 36.0–46.0)
Hemoglobin: 11.8 g/dL — ABNORMAL LOW (ref 12.0–15.0)
Immature Granulocytes: 0 %
Lymphocytes Relative: 16 %
Lymphs Abs: 1 10*3/uL (ref 0.7–4.0)
MCH: 28.9 pg (ref 26.0–34.0)
MCHC: 31.9 g/dL (ref 30.0–36.0)
MCV: 90.7 fL (ref 80.0–100.0)
Monocytes Absolute: 0.3 10*3/uL (ref 0.1–1.0)
Monocytes Relative: 5 %
Neutro Abs: 4.8 10*3/uL (ref 1.7–7.7)
Neutrophils Relative %: 79 %
Platelets: 169 10*3/uL (ref 150–400)
RBC: 4.08 MIL/uL (ref 3.87–5.11)
RDW: 13.8 % (ref 11.5–15.5)
WBC: 6 10*3/uL (ref 4.0–10.5)
nRBC: 0 % (ref 0.0–0.2)

## 2019-10-09 LAB — URINALYSIS, COMPLETE (UACMP) WITH MICROSCOPIC
Bilirubin Urine: NEGATIVE
Glucose, UA: NEGATIVE mg/dL
Hgb urine dipstick: NEGATIVE
Ketones, ur: 5 mg/dL — AB
Leukocytes,Ua: NEGATIVE
Nitrite: NEGATIVE
Protein, ur: NEGATIVE mg/dL
Specific Gravity, Urine: 1.01 (ref 1.005–1.030)
pH: 6 (ref 5.0–8.0)

## 2019-10-09 MED ORDER — SODIUM CHLORIDE 0.9 % IV SOLN
2400.0000 mg/m2 | INTRAVENOUS | Status: DC
  Administered 2019-10-09: 14:00:00 4050 mg via INTRAVENOUS
  Filled 2019-10-09: qty 81

## 2019-10-09 MED ORDER — OXALIPLATIN CHEMO INJECTION 100 MG/20ML
85.0000 mg/m2 | Freq: Once | INTRAVENOUS | Status: AC
  Administered 2019-10-09: 145 mg via INTRAVENOUS
  Filled 2019-10-09: qty 20

## 2019-10-09 MED ORDER — SODIUM CHLORIDE 0.9 % IV SOLN
INTRAVENOUS | Status: DC
  Filled 2019-10-09: qty 250

## 2019-10-09 MED ORDER — MIRTAZAPINE 15 MG PO TABS: 15.0000 mg | ORAL_TABLET | Freq: Every day | ORAL | 3 refills | Status: DC

## 2019-10-09 MED ORDER — SODIUM CHLORIDE 0.9% FLUSH
10.0000 mL | Freq: Once | INTRAVENOUS | Status: AC
  Administered 2019-10-09: 10 mL via INTRAVENOUS
  Filled 2019-10-09: qty 10

## 2019-10-09 MED ORDER — PALONOSETRON HCL INJECTION 0.25 MG/5ML
0.2500 mg | Freq: Once | INTRAVENOUS | Status: AC
  Administered 2019-10-09: 0.25 mg via INTRAVENOUS
  Filled 2019-10-09: qty 5

## 2019-10-09 MED ORDER — HEPARIN SOD (PORK) LOCK FLUSH 100 UNIT/ML IV SOLN
500.0000 [IU] | Freq: Once | INTRAVENOUS | Status: DC
  Filled 2019-10-09: qty 5

## 2019-10-09 MED ORDER — LEUCOVORIN CALCIUM INJECTION 350 MG
387.0000 mg/m2 | Freq: Once | INTRAVENOUS | Status: AC
  Administered 2019-10-09: 11:00:00 650 mg via INTRAVENOUS
  Filled 2019-10-09: qty 25

## 2019-10-09 MED ORDER — SODIUM CHLORIDE 0.9 % IV SOLN
5.0000 mg/kg | Freq: Once | INTRAVENOUS | Status: AC
  Administered 2019-10-09: 11:00:00 300 mg via INTRAVENOUS
  Filled 2019-10-09: qty 12

## 2019-10-09 MED ORDER — SODIUM CHLORIDE 0.9 % IV SOLN
10.0000 mg | Freq: Once | INTRAVENOUS | Status: AC
  Administered 2019-10-09: 10 mg via INTRAVENOUS
  Filled 2019-10-09: qty 10

## 2019-10-09 MED ORDER — DEXTROSE 5 % IV SOLN
Freq: Once | INTRAVENOUS | Status: AC
  Filled 2019-10-09: qty 250

## 2019-10-09 NOTE — Assessment & Plan Note (Addendum)
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  Hold MRI/surgical consultation at this time [patient preference].  Discussed with Dr. Baltazar Apo agrees to proceed with chemotherapy now; followed by surgical evaluation after about 3 months of therapy.  Also discussed with Dr. Tollie Pizza.  # proceed with "neoadjuvant therapy" FOLFOX + avastin chemotherapy q 2 weeks.  We will plan at least 3 months of chemotherapy; restage; will also get MRI at that time. Labs today reviewed;  acceptable for treatment today.  Again discussed that stage IV colon cancers are "cured" only in a minority of patients [20-30%].  In general indefinite therapy is recommended; however this could always be modified given patient's clinical status/response.  # Discussed the potential side effects including but not limited to-increasing fatigue, nausea vomiting, diarrhea, hair loss, sores in the mouth, increase risk of infection and also neuropathy.    # Hypokalemia- 3.2; recommend dietary supplementation.  # weight loss- nervous/difficulty sleeping at night/anxiety.  Recommend remeron 15 mg qhs.    # DISPOSITION:  # referral to [facility]- weight loss # FOLFOX+avastin today; pump off in 2 days.  # follow up in 2 weeks; MD;  FOLFOX + avastin; pump off in 2 days;  labs- cbc/cmp/cea;UA-Dr.B

## 2019-10-09 NOTE — Progress Notes (Signed)
[place] CONSULT NOTE  Patient Care Team: Dion Body, MD as PCP - General (Family Medicine) Clent Jacks, RN as Oncology Nurse Navigator  CHIEF COMPLAINTS/PURPOSE OF CONSULTATION: Colon cancer  #  Oncology History Overview Note  # NOV 2020 - SIGMOID COLON CANCER; pT4pN2 [5/26LN]? M1 [liver lesion; solitary]; left hemicolectomy; Dr.Byrnett; invasion of visceral peritoneum; G-2; NEG-margins;  NOV 13/2020-Liver lesion 1.2cm / left hepatic; 4 mm left lower lobe lung nodule; distal descending colon mass; enlarged lymph nodes obstruction present;  PET-September 24, 2019-solitary liver lesion; question proximal descending colon lesion/mesenteric lymph node-inflammatory versus malignancy. Discussed with Dr.Byerley [pt wants to hold referral now]  # JAN 5th 2020-FOLFOX +AVASTIN  # NGS/MOLECULAR TESTS: P; MSI-STABLE.   # PALLIATIVE CARE EVALUATION: P  # PAIN MANAGEMENT: none  DIAGNOSIS: COLON CA  STAGE:   IV      ;  GOALS: control  CURRENT/MOST RECENT THERAPY : FOLFOX + Avastin [C]    Cancer of left colon ([employer])  09/13/2019 Initial Diagnosis   Cancer of left colon ([place])   10/09/2019 -  Chemotherapy   The patient had palonosetron (ALOXI) injection 0.25 mg, 0.25 mg, Intravenous,  Once, 1 of 4 cycles leucovorin 672 mg in dextrose 5 % 250 mL infusion, 400 mg/m2 = 672 mg, Intravenous,  Once, 1 of 4 cycles oxaliplatin (ELOXATIN) 145 mg in dextrose 5 % 500 mL chemo infusion, 85 mg/m2 = 145 mg, Intravenous,  Once, 1 of 4 cycles fluorouracil (ADRUCIL) 4,050 mg in sodium chloride 0.9 % 69 mL chemo infusion, 2,400 mg/m2 = 4,050 mg, Intravenous, 1 Day/Dose, 1 of 4 cycles bevacizumab-bvzr (ZIRABEV) 300 mg in sodium chloride 0.9 % 100 mL chemo infusion, 5 mg/kg = 300 mg, Intravenous,  Once, 1 of 4 cycles  for chemotherapy treatment.       HISTORY OF PRESENTING ILLNESS:  Courtney Jefferson 79 y.o.  female left-sided colon cancer stage IV oligometastatic liver lesion is  here for follow-up/proceed with chemotherapy.  Patient has not had any hospitalization interim.  Her appetite is fair.  Complains of weight loss because of poor p.o. intake/secondary to nausea/anxiety.   Review of Systems  Constitutional: Positive for malaise/fatigue. Negative for chills, diaphoresis and fever.  HENT: Negative for nosebleeds and sore throat.   Eyes: Negative for double vision.  Respiratory: Negative for cough, hemoptysis, sputum production, shortness of breath and wheezing.   Cardiovascular: Negative for chest pain, palpitations, orthopnea and leg swelling.  Gastrointestinal: Negative for blood in stool, diarrhea, heartburn, melena, nausea and vomiting.  Genitourinary: Negative for dysuria, frequency and urgency.  Musculoskeletal: Negative for back pain and joint pain.  Skin: Negative.  Negative for itching and rash.  Neurological: Negative for dizziness, tingling, focal weakness, weakness and headaches.  Endo/Heme/Allergies: Does not bruise/bleed easily.  Psychiatric/Behavioral: Negative for depression. The patient is nervous/anxious. The patient does not have insomnia.      MEDICAL HISTORY:  Past Medical History:  Diagnosis Date  . Cancer [facility])    cancer of colon  . Hypertension   . PONV (postoperative nausea and vomiting)     SURGICAL HISTORY: Past Surgical History:  Procedure Laterality Date  . ABDOMINAL HYSTERECTOMY     partial  . COLECTOMY WITH COLOSTOMY CREATION/HARTMANN PROCEDURE  08/18/2019   Procedure: COLECTOMY WITH COLOSTOMY CREATION/HARTMANN PROCEDURE;  Surgeon: Robert Bellow, MD;  Location: [facility] ORS;  Service: General;;  . LAPAROTOMY N/A 08/18/2019   Procedure: Exploratory Laparotomy;  Surgeon: Robert Bellow, MD;  Location: [facility] ORS;  Service: General;  Laterality: N/A;  . PORTACATH PLACEMENT N/A 09/26/2019   Procedure: INSERTION PORT-A-CATH;  Surgeon: Herbert Pun, MD;  Location: [facility] ORS;  Service: General;  Laterality: N/A;     SOCIAL HISTORY: Social History   Socioeconomic History  . Marital status: Widowed    Spouse name: Not on file  . Number of children: Not on file  . Years of education: Not on file  . Highest education level: Not on file  Occupational History  . Not on file  Tobacco Use  . Smoking status: Former Research scientist (life sciences)  . Smokeless tobacco: Never Used  Substance and Sexual Activity  . Alcohol use: No  . Drug use: No  . Sexual activity: Not Currently    Birth control/protection: Surgical  Other Topics Concern  . Not on file  Social History Narrative   Lives in [place]; no smoking; no alcohol; [employer]. Lives by self; daughters live close by.    Social Determinants of Health   Financial Resource Strain:   . Difficulty of Paying Living Expenses: Not on file  Food Insecurity:   . Worried About Charity fundraiser in the Last Year: Not on file  . Ran Out of Food in the Last Year: Not on file  Transportation Needs:   . Lack of Transportation (Medical): Not on file  . Lack of Transportation (Non-Medical): Not on file  Physical Activity:   . Days of Exercise per Week: Not on file  . Minutes of Exercise per Session: Not on file  Stress:   . Feeling of Stress : Not on file  Social Connections:   . Frequency of Communication with Friends and Family: Not on file  . Frequency of Social Gatherings with Friends and Family: Not on file  . Attends Religious Services: Not on file  . Active Member of Clubs or Organizations: Not on file  . Attends Archivist Meetings: Not on file  . Marital Status: Not on file  Intimate Partner Violence:   . Fear of Current or Ex-Partner: Not on file  . Emotionally Abused: Not on file  . Physically Abused: Not on file  . Sexually Abused: Not on file    FAMILY HISTORY: Family History  Problem Relation Age of Onset  . Diabetes Sister   . Cancer Brother        died young    ALLERGIES:  has No Known Allergies.  MEDICATIONS:  Current  Outpatient Medications  Medication Sig Dispense Refill  . acetaminophen (TYLENOL) 325 MG tablet Take 2 tablets (650 mg total) by mouth every 6 (six) hours as needed. 60 tablet 0  . lidocaine-prilocaine (EMLA) cream Apply 1 application topically as needed. 30 g 0  . Black Cohosh 40 MG CAPS Take 40 mg by mouth daily.     . calcium carbonate (OSCAL) 1500 (600 Ca) MG TABS tablet Take 600 mg of elemental calcium by mouth 2 (two) times daily with a meal.     . mirtazapine (REMERON) 15 MG tablet Take 1 tablet (15 mg total) by mouth at bedtime. 30 tablet 3  . ondansetron (ZOFRAN) 8 MG tablet One pill every 8 hours as needed for nausea/vomitting. (Patient not taking: Reported on 09/26/2019) 40 tablet 1  . prochlorperazine (COMPAZINE) 10 MG tablet Take 1 tablet (10 mg total) by mouth every 6 (six) hours as needed for nausea or vomiting. (Patient not taking: Reported on 09/26/2019) 40 tablet 1   No current facility-administered medications for this visit.  Facility-Administered Medications Ordered in Other Visits  Medication Dose Route Frequency Provider Last Rate Last Admin  . 0.9 %  sodium chloride infusion   Intravenous Continuous Cammie Sickle, MD   Stopped at 10/09/19 1059  . fluorouracil (ADRUCIL) 4,050 mg in sodium chloride 0.9 % 69 mL chemo infusion  2,400 mg/m2 (Treatment Plan Recorded) Intravenous 1 day or 1 dose Charlaine Dalton R, MD      . heparin lock flush 100 unit/mL  500 Units Intravenous Once Charlaine Dalton R, MD      . leucovorin 650 mg in dextrose 5 % 250 mL infusion  387 mg/m2 (Treatment Plan Recorded) Intravenous Once Cammie Sickle, MD 141 mL/hr at 10/09/19 1107 650 mg at 10/09/19 1107  . oxaliplatin (ELOXATIN) 145 mg in dextrose 5 % 500 mL chemo infusion  85 mg/m2 (Treatment Plan Recorded) Intravenous Once Cammie Sickle, MD 265 mL/hr at 10/09/19 1105 145 mg at 10/09/19 1105      .  PHYSICAL EXAMINATION: ECOG PERFORMANCE STATUS: 1 - Symptomatic but  completely ambulatory  Vitals:   10/09/19 0852  BP: 137/75  Pulse: 82  Temp: 99.1 F (37.3 C)   Filed Weights   10/09/19 0852  Weight: 135 lb (61.2 kg)    Physical Exam  Constitutional: She is oriented to person, place, and time and well-developed, well-nourished, and in no distress.  Nervous.  Accompanied by friend.  HENT:  Head: Normocephalic and atraumatic.  Mouth/Throat: Oropharynx is clear and moist. No oropharyngeal exudate.  Eyes: Pupils are equal, round, and reactive to light.  Cardiovascular: Normal rate and regular rhythm.  Pulmonary/Chest: No respiratory distress. She has no wheezes.  Abdominal: Soft. Bowel sounds are normal. She exhibits no distension and no mass. There is no abdominal tenderness. There is no rebound and no guarding.  Colostomy in place-brown stool present.  Musculoskeletal:        General: No tenderness or edema. Normal range of motion.     Cervical back: Normal range of motion and neck supple.  Neurological: She is alert and oriented to person, place, and time.  Skin: Skin is warm.  Psychiatric: Affect normal.  Nervous     LABORATORY DATA:  I have reviewed the data as listed Lab Results  Component Value Date   WBC 6.0 10/09/2019   HGB 11.8 (L) 10/09/2019   HCT 37.0 10/09/2019   MCV 90.7 10/09/2019   PLT 169 10/09/2019   Recent Labs    08/25/19 1340 08/27/19 1022 09/13/19 1148 10/09/19 0820  NA 142 140 139 134*  K 3.8 3.6 3.8 3.2*  CL 102 107 107 104  CO2 '29 23 24 ' 19*  GLUCOSE 128* 105* 93 112*  BUN '17 12 13 10  ' CREATININE 0.81 0.73 0.69 0.68  CALCIUM 9.7 8.6* 9.3 8.8*  GFRNONAA >60 >60 >60 >60  GFRAA >60 >60 >60 >60  PROT 7.0  --  7.1 7.2  ALBUMIN 3.0*  --  3.6 3.7  AST 23  --  14* 28  ALT 28  --  17 26  ALKPHOS 66  --  49 57  BILITOT 0.7  --  0.4 0.5    RADIOGRAPHIC STUDIES: I have personally reviewed the radiological images as listed and agreed with the findings in the report. NM PET Image Initial (PI) Skull Base  To Thigh  Result Date: 09/24/2019 CLINICAL DATA:  Subsequent treatment strategy for colorectal carcinoma. Hemicolectomy November 11/20. EXAM: NUCLEAR MEDICINE PET SKULL BASE TO THIGH TECHNIQUE: 7.6 mCi F-18  FDG was injected intravenously. Full-ring PET imaging was performed from the skull base to thigh after the radiotracer. CT data was obtained and used for attenuation correction and anatomic localization. Fasting blood glucose: 76 mg/dl COMPARISON:  Diagnostic CT scan 08/17/2019 FINDINGS: Mediastinal blood pool activity: SUV max 2.1 Liver activity: SUV max NA NECK: No hypermetabolic lymph nodes in the neck. Incidental CT findings: none CHEST: 3 mm nodule in the LEFT upper lobe (image 16/1) without metabolic activity. No hypermetabolic mediastinal lymph nodes. Incidental CT findings: none ABDOMEN/PELVIS: A single focus hypermetabolic activity is present in the LEFT lateral hepatic lobe. This lesion is not identified on the noncontrast CT. Lesion is relatively small measuring approximately 1.5 cm with SUV max equal 4.7 (image 167). The this does correspond to a hepatic lesion on diagnostic scan from levin 13 20. No hypermetabolic lymph nodes in porta hepatis or retroperitoneum. No hypermetabolic pelvic lymph nodes. There is a hypermetabolic nodule within the mesentery of the LEFT lower quadrant measuring 1.6 cm (image 13182/3) with SUV max equal 9.1. A focus of intense metabolic activity is present within distal sigmoid colon with SUV max equal 20.5. There is a subtle ovoid lesion within the wall of colon wall at this level measuring 14 mm 10 mm (image 217/3) Incidental CT findings: LEFT lower quadrant colostomy. Calcified body in the LEFT upper quadrant. SKELETON: No focal hypermetabolic activity to suggest skeletal metastasis. Incidental CT findings: none IMPRESSION: 1. Hypermetabolic lesion in the LEFT hepatic lobe corresponds to lesion on comparison contrast CT. 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 2.  Interval partial LEFT hemicolectomy. 3. Hypermetabolic metastatic lymph node within the descending colon mesentery. 4. Lesion within the wall of the proximal sigmoid colon concerning for neoplasm. 5. Small pulmonary nodule without metabolic activity is favored benign. Electronically Signed   By: Suzy Bouchard M.D.   On: 09/24/2019 15:39   DG Chest Port 1 View  Result Date: 09/26/2019 CLINICAL DATA:  Status post Port-A-Cath insertion EXAM: PORTABLE CHEST 1 VIEW COMPARISON:  08/25/2019 FINDINGS: There is a left-sided Port-A-Cath with the tip projecting over the SVC. There is no focal consolidation. There is chronic bilateral interstitial thickening. There is no pleural effusion or pneumothorax. The heart and mediastinal contours are unremarkable. There is a dense calcified mass in the left upper quadrant. There is no acute osseous abnormality. IMPRESSION: Left-sided Port-A-Cath with the tip projecting over the SVC. Electronically Signed   By: Kathreen Devoid   On: 09/26/2019 13:07   DG C-Arm 1-60 Min-No Report  Result Date: 09/26/2019 Fluoroscopy was utilized by the requesting physician.  No radiographic interpretation.    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  Hold MRI/surgical consultation at this time [patient preference].  Discussed with Dr. Baltazar Apo agrees to proceed with chemotherapy now; followed by surgical evaluation after about 3 months of therapy.  Also discussed with Dr. Tollie Pizza.  # proceed with "neoadjuvant therapy" FOLFOX + avastin chemotherapy q 2 weeks.  We will plan at least 3 months of chemotherapy; restage; will also get MRI at that time. Labs today reviewed;  acceptable for treatment today.  Again discussed that stage IV colon cancers are "cured"  only in a minority of patients [20-30%].  In general indefinite therapy is recommended; however this could always be modified given patient's clinical status/response.  # Discussed  the potential side effects including but not limited to-increasing fatigue, nausea vomiting, diarrhea, hair loss, sores in the mouth, increase risk of infection and also neuropathy.    # Hypokalemia- 3.2; recommend dietary supplementation.  # weight loss- nervous/difficulty sleeping at night/anxiety.  Recommend remeron 15 mg qhs.    # DISPOSITION:  # referral to [facility]- weight loss # FOLFOX+avastin today; pump off in 2 days.  # follow up in 2 weeks; MD;  FOLFOX + avastin; pump off in 2 days;  labs- cbc/cmp/cea;UA-Dr.B  All questions were answered. The patient knows to call the clinic with any problems, questions or concerns.    Cammie Sickle, MD 10/09/2019 11:34 AM

## 2019-10-10 ENCOUNTER — Telehealth: Payer: Self-pay

## 2019-10-10 LAB — CEA: CEA: 8.5 ng/mL — ABNORMAL HIGH (ref 0.0–4.7)

## 2019-10-10 NOTE — Telephone Encounter (Signed)
Telephone call to patient for follow up after receiving first infusion yesterday.  Patient states not feeling so good today.  States when drinking fluids it feels like something "cold" in her throat.  Encouraged patient to drink all fluids at room temperature or warm.  Denies any nausea.  Encouraged patient to call for any questions or concerns.

## 2019-10-11 ENCOUNTER — Ambulatory Visit: Payer: Medicare Other | Attending: Internal Medicine

## 2019-10-11 ENCOUNTER — Inpatient Hospital Stay: Payer: Medicare Other

## 2019-10-11 DIAGNOSIS — Z95828 Presence of other vascular implants and grafts: Secondary | ICD-10-CM

## 2019-10-11 DIAGNOSIS — Z5111 Encounter for antineoplastic chemotherapy: Secondary | ICD-10-CM | POA: Diagnosis not present

## 2019-10-11 DIAGNOSIS — Z20822 Contact with and (suspected) exposure to covid-19: Secondary | ICD-10-CM

## 2019-10-12 MED ORDER — SODIUM CHLORIDE 0.9% FLUSH
10.0000 mL | INTRAVENOUS | Status: DC | PRN
  Administered 2019-10-11: 10 mL via INTRAVENOUS
  Filled 2019-10-12: qty 10

## 2019-10-12 MED ORDER — HEPARIN SOD (PORK) LOCK FLUSH 100 UNIT/ML IV SOLN
500.0000 [IU] | Freq: Once | INTRAVENOUS | Status: AC
  Administered 2019-10-11: 500 [IU] via INTRAVENOUS
  Filled 2019-10-12: qty 5

## 2019-10-13 LAB — NOVEL CORONAVIRUS, NAA: SARS-CoV-2, NAA: DETECTED — AB

## 2019-10-16 ENCOUNTER — Other Ambulatory Visit: Payer: Self-pay | Admitting: General Surgery

## 2019-10-16 DIAGNOSIS — R05 Cough: Secondary | ICD-10-CM

## 2019-10-16 DIAGNOSIS — R059 Cough, unspecified: Secondary | ICD-10-CM

## 2019-10-16 MED ORDER — HYDROCOD POLST-CPM POLST ER 10-8 MG/5ML PO SUER: 2.5000 mL | Freq: Two times a day (BID) | ORAL | 0 refills | Status: DC | PRN

## 2019-10-16 NOTE — Progress Notes (Signed)
tussin

## 2019-10-17 ENCOUNTER — Encounter: Payer: Self-pay | Admitting: Emergency Medicine

## 2019-10-17 ENCOUNTER — Other Ambulatory Visit: Payer: Self-pay

## 2019-10-17 ENCOUNTER — Inpatient Hospital Stay
Admission: EM | Admit: 2019-10-17 | Discharge: 2019-10-22 | DRG: 177 | Disposition: A | Discharge status: Home Health | Payer: Medicare Other | Attending: Internal Medicine | Admitting: Internal Medicine

## 2019-10-17 ENCOUNTER — Emergency Department: Payer: Medicare Other

## 2019-10-17 DIAGNOSIS — Z90711 Acquired absence of uterus with remaining cervical stump: Secondary | ICD-10-CM | POA: Diagnosis not present

## 2019-10-17 DIAGNOSIS — Z79899 Other long term (current) drug therapy: Secondary | ICD-10-CM

## 2019-10-17 DIAGNOSIS — N390 Urinary tract infection, site not specified: Secondary | ICD-10-CM | POA: Diagnosis present

## 2019-10-17 DIAGNOSIS — E876 Hypokalemia: Secondary | ICD-10-CM | POA: Diagnosis not present

## 2019-10-17 DIAGNOSIS — R531 Weakness: Secondary | ICD-10-CM | POA: Diagnosis not present

## 2019-10-17 DIAGNOSIS — Z87891 Personal history of nicotine dependence: Secondary | ICD-10-CM

## 2019-10-17 DIAGNOSIS — Z9049 Acquired absence of other specified parts of digestive tract: Secondary | ICD-10-CM | POA: Diagnosis not present

## 2019-10-17 DIAGNOSIS — J1282 Pneumonia due to coronavirus disease 2019: Secondary | ICD-10-CM | POA: Diagnosis present

## 2019-10-17 DIAGNOSIS — I10 Essential (primary) hypertension: Secondary | ICD-10-CM | POA: Diagnosis not present

## 2019-10-17 DIAGNOSIS — Z833 Family history of diabetes mellitus: Secondary | ICD-10-CM | POA: Diagnosis not present

## 2019-10-17 DIAGNOSIS — Z8719 Personal history of other diseases of the digestive system: Secondary | ICD-10-CM | POA: Diagnosis not present

## 2019-10-17 DIAGNOSIS — Z9981 Dependence on supplemental oxygen: Secondary | ICD-10-CM

## 2019-10-17 DIAGNOSIS — E871 Hypo-osmolality and hyponatremia: Secondary | ICD-10-CM | POA: Diagnosis present

## 2019-10-17 DIAGNOSIS — B9620 Unspecified Escherichia coli [E. coli] as the cause of diseases classified elsewhere: Secondary | ICD-10-CM | POA: Diagnosis present

## 2019-10-17 DIAGNOSIS — U071 COVID-19: Principal | ICD-10-CM

## 2019-10-17 DIAGNOSIS — C186 Malignant neoplasm of descending colon: Secondary | ICD-10-CM | POA: Diagnosis not present

## 2019-10-17 DIAGNOSIS — Z9221 Personal history of antineoplastic chemotherapy: Secondary | ICD-10-CM

## 2019-10-17 DIAGNOSIS — J9601 Acute respiratory failure with hypoxia: Secondary | ICD-10-CM | POA: Diagnosis not present

## 2019-10-17 DIAGNOSIS — E878 Other disorders of electrolyte and fluid balance, not elsewhere classified: Secondary | ICD-10-CM | POA: Diagnosis present

## 2019-10-17 DIAGNOSIS — R0602 Shortness of breath: Secondary | ICD-10-CM | POA: Diagnosis not present

## 2019-10-17 DIAGNOSIS — Z95828 Presence of other vascular implants and grafts: Secondary | ICD-10-CM | POA: Diagnosis not present

## 2019-10-17 DIAGNOSIS — Z933 Colostomy status: Secondary | ICD-10-CM

## 2019-10-17 LAB — BASIC METABOLIC PANEL
Anion gap: 9 (ref 5–15)
BUN: 20 mg/dL (ref 8–23)
CO2: 19 mmol/L — ABNORMAL LOW (ref 22–32)
Calcium: 7.5 mg/dL — ABNORMAL LOW (ref 8.9–10.3)
Chloride: 101 mmol/L (ref 98–111)
Creatinine, Ser: 0.75 mg/dL (ref 0.44–1.00)
GFR calc Af Amer: >60 mL/min (ref >60)
GFR calc non Af Amer: >60 mL/min (ref >60)
Glucose, Bld: 118 mg/dL — ABNORMAL HIGH (ref 70–99)
Potassium: 3.1 mmol/L — ABNORMAL LOW (ref 3.5–5.1)
Sodium: 129 mmol/L — ABNORMAL LOW (ref 135–145)

## 2019-10-17 LAB — CBC
HCT: 36.7 % (ref 36.0–46.0)
Hemoglobin: 12.4 g/dL (ref 12.0–15.0)
MCH: 28.7 pg (ref 26.0–34.0)
MCHC: 33.8 g/dL (ref 30.0–36.0)
MCV: 85 fL (ref 80.0–100.0)
Platelets: 166 10*3/uL (ref 150–400)
RBC: 4.32 MIL/uL (ref 3.87–5.11)
RDW: 14 % (ref 11.5–15.5)
WBC: 3.6 10*3/uL — ABNORMAL LOW (ref 4.0–10.5)
nRBC: 0 % (ref 0.0–0.2)

## 2019-10-17 LAB — TROPONIN I (HIGH SENSITIVITY): Troponin I (High Sensitivity): 21 ng/L — ABNORMAL HIGH (ref <18)

## 2019-10-17 MED ORDER — MAGNESIUM HYDROXIDE 400 MG/5ML PO SUSP: 30.0000 mL | Freq: Every day | ORAL | Status: DC | PRN

## 2019-10-17 MED ORDER — ASCORBIC ACID 500 MG PO TABS
500.0000 mg | ORAL_TABLET | Freq: Every day | ORAL | Status: DC
  Administered 2019-10-18 – 2019-10-22 (×5): 500 mg via ORAL
  Filled 2019-10-17 (×5): qty 1

## 2019-10-17 MED ORDER — ASPIRIN EC 81 MG PO TBEC
81.0000 mg | DELAYED_RELEASE_TABLET | Freq: Every day | ORAL | Status: DC
  Administered 2019-10-18 – 2019-10-22 (×5): 81 mg via ORAL
  Filled 2019-10-17 (×5): qty 1

## 2019-10-17 MED ORDER — ONDANSETRON HCL 4 MG PO TABS: 4.0000 mg | ORAL_TABLET | Freq: Four times a day (QID) | ORAL | Status: DC | PRN

## 2019-10-17 MED ORDER — SODIUM CHLORIDE 0.9 % IV SOLN: INTRAVENOUS | Status: DC

## 2019-10-17 MED ORDER — BLACK COHOSH 40 MG PO CAPS: 40.0000 mg | ORAL_CAPSULE | Freq: Every day | ORAL | Status: DC

## 2019-10-17 MED ORDER — GUAIFENESIN-DM 100-10 MG/5ML PO SYRP
10.0000 mL | ORAL_SOLUTION | ORAL | Status: DC | PRN
  Administered 2019-10-20 – 2019-10-21 (×4): 10 mL via ORAL
  Filled 2019-10-17 (×5): qty 10

## 2019-10-17 MED ORDER — FAMOTIDINE 20 MG PO TABS
20.0000 mg | ORAL_TABLET | Freq: Every day | ORAL | Status: DC
  Administered 2019-10-18 – 2019-10-22 (×6): 20 mg via ORAL
  Filled 2019-10-17 (×6): qty 1

## 2019-10-17 MED ORDER — HYDROCOD POLST-CPM POLST ER 10-8 MG/5ML PO SUER: 5.0000 mL | Freq: Two times a day (BID) | ORAL | Status: DC | PRN

## 2019-10-17 MED ORDER — ENOXAPARIN SODIUM 40 MG/0.4ML ~~LOC~~ SOLN
40.0000 mg | SUBCUTANEOUS | Status: DC
  Administered 2019-10-18 – 2019-10-21 (×5): 40 mg via SUBCUTANEOUS
  Filled 2019-10-17 (×5): qty 0.4

## 2019-10-17 MED ORDER — SODIUM CHLORIDE 0.9 % IV SOLN
200.0000 mg | Freq: Once | INTRAVENOUS | Status: AC
  Administered 2019-10-18: 200 mg via INTRAVENOUS
  Filled 2019-10-17: qty 200

## 2019-10-17 MED ORDER — ACETAMINOPHEN 325 MG PO TABS: 650.0000 mg | ORAL_TABLET | Freq: Four times a day (QID) | ORAL | Status: DC | PRN

## 2019-10-17 MED ORDER — SODIUM CHLORIDE 0.9 % IV SOLN
100.0000 mg | Freq: Every day | INTRAVENOUS | Status: DC
  Filled 2019-10-17: qty 20

## 2019-10-17 MED ORDER — MIRTAZAPINE 15 MG PO TABS
15.0000 mg | ORAL_TABLET | Freq: Every day | ORAL | Status: DC
  Administered 2019-10-18 – 2019-10-21 (×5): 15 mg via ORAL
  Filled 2019-10-17 (×5): qty 1

## 2019-10-17 MED ORDER — HYDROCOD POLST-CPM POLST ER 10-8 MG/5ML PO SUER: 2.5000 mL | Freq: Two times a day (BID) | ORAL | Status: DC | PRN

## 2019-10-17 MED ORDER — ONDANSETRON HCL 4 MG/2ML IJ SOLN: 4.0000 mg | Freq: Four times a day (QID) | INTRAMUSCULAR | Status: DC | PRN

## 2019-10-17 MED ORDER — VITAMIN D 25 MCG (1000 UNIT) PO TABS
1000.0000 [IU] | ORAL_TABLET | Freq: Every day | ORAL | Status: DC
  Administered 2019-10-18 – 2019-10-22 (×5): 1000 [IU] via ORAL
  Filled 2019-10-17 (×5): qty 1

## 2019-10-17 MED ORDER — DEXAMETHASONE SODIUM PHOSPHATE 10 MG/ML IJ SOLN
6.0000 mg | INTRAMUSCULAR | Status: DC
  Administered 2019-10-18 – 2019-10-20 (×4): 6 mg via INTRAVENOUS
  Filled 2019-10-17: qty 0.6
  Filled 2019-10-17 (×4): qty 1

## 2019-10-17 MED ORDER — DEXAMETHASONE SODIUM PHOSPHATE 10 MG/ML IJ SOLN
10.0000 mg | Freq: Once | INTRAMUSCULAR | Status: AC
  Administered 2019-10-17: 10 mg via INTRAVENOUS
  Filled 2019-10-17: qty 1

## 2019-10-17 MED ORDER — CALCIUM CARBONATE ANTACID 500 MG PO CHEW
600.0000 mg | CHEWABLE_TABLET | Freq: Two times a day (BID) | ORAL | Status: DC
  Administered 2019-10-18 – 2019-10-22 (×9): 600 mg via ORAL
  Filled 2019-10-17 (×10): qty 3

## 2019-10-17 MED ORDER — ZINC SULFATE 220 (50 ZN) MG PO CAPS
220.0000 mg | ORAL_CAPSULE | Freq: Every day | ORAL | Status: DC
  Administered 2019-10-18 – 2019-10-22 (×5): 220 mg via ORAL
  Filled 2019-10-17 (×5): qty 1

## 2019-10-17 MED ORDER — GUAIFENESIN ER 600 MG PO TB12
600.0000 mg | ORAL_TABLET | Freq: Two times a day (BID) | ORAL | Status: DC
  Administered 2019-10-18 – 2019-10-22 (×10): 600 mg via ORAL
  Filled 2019-10-17 (×10): qty 1

## 2019-10-17 MED ORDER — TRAZODONE HCL 50 MG PO TABS
25.0000 mg | ORAL_TABLET | Freq: Every evening | ORAL | Status: DC | PRN
  Administered 2019-10-19 – 2019-10-20 (×2): 25 mg via ORAL
  Filled 2019-10-17 (×2): qty 1

## 2019-10-17 NOTE — ED Provider Notes (Signed)
[facility] Emergency Department Provider Note   ____________________________________________   First MD Initiated Contact with Patient 10/17/19 2057     (approximate)  I have reviewed the triage vital signs and the nursing notes.   HISTORY  Chief Complaint Weakness    HPI Courtney Jefferson is a 79 y.o. female with past medical history of colon cancer, hypertension who presents to the ED complaining of generalized weakness and shortness of breath.  Patient reports that she tested positive for Covid 6 days ago and has been feeling increasingly weak since then.  She had an episode today where she felt very lightheaded and briefly lost consciousness, but states her daughter was able to catch her and lowered her to the ground.  She denies hitting her head and denies any areas of pain currently.  She does state that she has been feeling very short of breath with a nonproductive cough.  She denies any associated chest pain.        Past Medical History:  Diagnosis Date  . Cancer [facility])    cancer of colon  . Hypertension   . PONV (postoperative nausea and vomiting)     Patient Active Problem List   Diagnosis Date Noted  . COVID-19 10/17/2019  . Cancer of left colon ([place]) 09/13/2019  . Goals of care, counseling/discussion 09/13/2019  . Diverticulitis large intestine w/o perforation or abscess w/o bleeding 08/25/2019  . Nausea with vomiting 08/25/2019  . Post-operative nausea and vomiting 08/25/2019  . Colonic obstruction ([place]) 08/17/2019  . Hypertension 05/03/2017  . 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 ABDOMINAL HYSTERECTOMY     partial  . COLECTOMY WITH COLOSTOMY CREATION/HARTMANN PROCEDURE  08/18/2019   Procedure: COLECTOMY WITH COLOSTOMY CREATION/HARTMANN PROCEDURE;  Surgeon: Robert Bellow, MD;  Location: [facility] ORS;  Service: General;;  . LAPAROTOMY N/A 08/18/2019   Procedure:  Exploratory Laparotomy;  Surgeon: Robert Bellow, MD;  Location: [facility] ORS;  Service: General;  Laterality: N/A;  . PORTACATH PLACEMENT N/A 09/26/2019   Procedure: INSERTION PORT-A-CATH;  Surgeon: Herbert Pun, MD;  Location: [facility] ORS;  Service: General;  Laterality: N/A;    Prior to Admission medications   Medication Sig Start Date End Date Taking? Authorizing Provider  acetaminophen (TYLENOL) 325 MG tablet Take 2 tablets (650 mg total) by mouth every 6 (six) hours as needed. 03/15/18   Carrie Mew, MD  Black Cohosh 40 MG CAPS Take 40 mg by mouth daily.     [provider]  calcium carbonate (OSCAL) 1500 (600 Ca) MG TABS tablet Take 600 mg of elemental calcium by mouth 2 (two) times daily with a meal.     [provider]  chlorpheniramine-HYDROcodone (TUSSIONEX PENNKINETIC ER) 10-8 MG/5ML SUER Take 2.5 mLs by mouth every 12 (twelve) hours as needed for cough. 10/16/19   Robert Bellow, MD  lidocaine-prilocaine (EMLA) cream Apply 1 application topically as needed. 09/24/19   Cammie Sickle, MD  mirtazapine (REMERON) 15 MG tablet Take 1 tablet (15 mg total) by mouth at bedtime. 10/09/19   Cammie Sickle, MD  ondansetron (ZOFRAN) 8 MG tablet One pill every 8 hours as needed for nausea/vomitting. Patient not taking: Reported on 09/26/2019 09/24/19   Cammie Sickle, MD  prochlorperazine (COMPAZINE) 10 MG tablet Take 1 tablet (10 mg total) by mouth every 6 (six) hours as needed for nausea or vomiting. Patient not taking: Reported on 09/26/2019 09/24/19  Cammie Sickle, MD    Allergies Patient has no known allergies.  Family History  Problem Relation Age of Onset  . Diabetes Sister   . Cancer Brother        died young    Social History Social History   Tobacco Use  . Smoking status: Former Research scientist (life sciences)  . Smokeless tobacco: Never Used  Substance Use Topics  . Alcohol use: No  . Drug use: No    Review of  Systems  Constitutional: No fever/chills.  Positive for generalized weakness. Eyes: No visual changes. ENT: No sore throat. Cardiovascular: Denies chest pain.  Positive for syncope. Respiratory: Positive cough and shortness of breath. Gastrointestinal: No abdominal pain.  No nausea, no vomiting.  No diarrhea.  No constipation. Genitourinary: Negative for dysuria. Musculoskeletal: Negative for back pain. Skin: Negative for rash. Neurological: Negative for headaches, focal weakness or numbness.  ____________________________________________   PHYSICAL EXAM:  VITAL SIGNS: ED Triage Vitals  Enc Vitals Group     BP 10/17/19 1331 111/61     Pulse Rate 10/17/19 1331 89     Resp 10/17/19 1331 16     Temp 10/17/19 1331 98.9 F (37.2 C)     Temp Source 10/17/19 1331 Oral     SpO2 10/17/19 1331 91 %     Weight 10/17/19 1332 134 lb 14.7 oz (61.2 kg)     Height 10/17/19 1332 5\' 3"  (1.6 m)     Head Circumference --      Peak Flow --      Pain Score 10/17/19 1332 0     Pain Loc --      Pain Edu? --      Excl. in [place]? --     Constitutional: Alert and oriented. Eyes: Conjunctivae are normal. Head: Atraumatic. Nose: No congestion/rhinnorhea. Mouth/Throat: Mucous membranes are moist. Neck: Normal ROM Cardiovascular: Tachycardic, regular rhythm. Grossly normal heart sounds. Respiratory: Tachypneic with normal respiratory effort.  No retractions. Lungs CTAB. Gastrointestinal: Soft and nontender. No distention. Genitourinary: deferred Musculoskeletal: No lower extremity tenderness nor edema. Neurologic:  Normal speech and language. No gross focal neurologic deficits are appreciated. Skin:  Skin is warm, dry and intact. No rash noted. Psychiatric: Mood and affect are normal. Speech and behavior are normal.  ____________________________________________   LABS (all labs ordered are listed, but only abnormal results are displayed)  Labs Reviewed  BASIC METABOLIC PANEL - Abnormal;  Notable for the following components:      Result Value   Sodium 129 (*)    Potassium 3.1 (*)    CO2 19 (*)    Glucose, Bld 118 (*)    Calcium 7.5 (*)    All other components within normal limits  CBC - Abnormal; Notable for the following components:   WBC 3.6 (*)    All other components within normal limits  TROPONIN I (HIGH SENSITIVITY) - Abnormal; Notable for the following components:   Troponin I (High Sensitivity) 21 (*)    All other components within normal limits  URINALYSIS, COMPLETE (UACMP) WITH MICROSCOPIC  C-REACTIVE PROTEIN  FIBRIN DERIVATIVES D-DIMER ([facility] ONLY)  PROCALCITONIN  FERRITIN   ____________________________________________  EKG  ED ECG REPORT I, Blake Divine, the attending physician, personally viewed and interpreted this ECG.   Date: 10/17/2019  EKG Time: 13:46  Rate: 88  Rhythm: normal sinus rhythm  Axis: Normal  Intervals:none  ST&T Change: None  PROCEDURES  Procedure(s) performed (including Critical Care):  Procedures   ____________________________________________   INITIAL IMPRESSION /  ASSESSMENT AND PLAN / ED COURSE       79 year old female with history of colon cancer recently started on chemotherapy presents to the ED complaining of increased weakness and shortness of breath after being diagnosed with COVID-19 6 days ago.  She is not in any respiratory distress but is tachypneic with low O2 sats on room air, subsequently placed on 2 L nasal cannula with improvement.  Chest x-ray appears consistent with COVID-19 and we will give a dose of steroids.  She also had a syncopal episode earlier today but denies any trauma associated with this.  There is no evidence of traumatic injury and EKG shows no evidence of arrhythmia or ischemia.  Troponin is very mildly elevated, but I doubt ACS.  She will require admission for further management of Covid pneumonia.  Case discussed with hospitalist, who accepts patient for admission.       ____________________________________________   FINAL CLINICAL IMPRESSION(S) / ED DIAGNOSES  Final diagnoses:  Pneumonia due to COVID-19 virus  Shortness of breath  Generalized weakness  Cancer of left colon [facility])     ED Discharge Orders    None       Note:  This document was prepared using Dragon voice recognition software and may include unintentional dictation errors.   Blake Divine, MD 10/17/19 [phone]

## 2019-10-17 NOTE — ED Triage Notes (Signed)
Pt in via EMS from home with c/o near syncopal episode and positive orthostatic changes. Pt tested positive for COVID Saturday and is undergoing treatment for cancer. #20 left AC, pt received 600cc of fluid en route.

## 2019-10-17 NOTE — H&P (Addendum)
[place] [facility] [place] NAME: Courtney Jefferson    MR#:  [id number]  DATE OF BIRTH:  [birth year]  DATE OF ADMISSION:  10/17/2019  PRIMARY CARE PHYSICIAN: Cammie Sickle, MD   REQUESTING/REFERRING PHYSICIAN: Blake Divine, MD CHIEF COMPLAINT:   Chief Complaint  Patient presents with  . Weakness    HISTORY OF PRESENT ILLNESS:  Courtney Jefferson  is a 79 y.o. Caucasian female with a known history of colon cancer on chemotherapy and hypertension who presented to the emergency room with acute onset of dyspnea for the last couple weeks with associated cough that is mainly dry and occasionally productive of clear sputum as well as wheezing with no fever or chills. She denied any loss of taste or smell. No nausea or vomiting or diarrhea. No headache or dizziness or blurred vision. Her last chemotherapy session was about a couple weeks ago and her next one is due on Tuesday. She denies dysuria, oliguria or hematuria or flank pain. No chest pain or palpitations. No abdominal pain and no bleeding diathesis. Patient had a positive COVID-19 test on 10/11/2019.  Upon presentation to the Emergency Room, vital signs were within normal except for pulse currently of 91% on room air that was dropping with movement and therefore the patient was placed on 2 L O2 by nasal cannula with pulse oximetry of 95. 96%. Blood pressure was later up to 180/85 with a pulse of 110 respirate of 31. Labs revealed mild hyponatremia and hypochloremia and AST 49. High-sensitivity troponin I was 21 and later 24 and CBC showed mild leukopenia of 2.4. Fibrin derivatives D-dimer came back 4867.76 and serum ferritin came back 1228 with CRP of 21.5. Urinalysis showed 11-20 WBCs with positive nitrite and many bacteria. Portable chest ray showed new interstitial and groundglass opacities at the bases concerning for pneumonia possibly atypical or viral pneumonia.EKG showed no sinus rhythm with a rate of 88 with no acute  findings.  The patient was given 10 mg of IV Decadron. She will be admitted to a medical monitored bed for further evaluation and management. PAST MEDICAL HISTORY:   Past Medical History:  Diagnosis Date  . Cancer [facility])    cancer of colon  . Hypertension   . PONV (postoperative nausea and vomiting)     PAST SURGICAL HISTORY:   Past Surgical History:  Procedure Laterality Date  . ABDOMINAL HYSTERECTOMY     partial  . COLECTOMY WITH COLOSTOMY CREATION/HARTMANN PROCEDURE  08/18/2019   Procedure: COLECTOMY WITH COLOSTOMY CREATION/HARTMANN PROCEDURE;  Surgeon: Robert Bellow, MD;  Location: [facility] ORS;  Service: General;;  . LAPAROTOMY N/A 08/18/2019   Procedure: Exploratory Laparotomy;  Surgeon: Robert Bellow, MD;  Location: [place] ORS;  Service: General;  Laterality: N/A;  . PORTACATH PLACEMENT N/A 09/26/2019   Procedure: INSERTION PORT-A-CATH;  Surgeon: Herbert Pun, MD;  Location: [facility] ORS;  Service: General;  Laterality: N/A;    SOCIAL HISTORY:   Social History   Tobacco Use  . Smoking status: Former Research scientist (life sciences)  . Smokeless tobacco: Never Used  Substance Use Topics  . Alcohol use: No    FAMILY HISTORY:   Family History  Problem Relation Age of Onset  . Diabetes Sister   . Cancer Brother        died young    DRUG ALLERGIES:  No Known Allergies  REVIEW OF SYSTEMS:   ROS As per history of present illness. All pertinent systems were reviewed above. Constitutional,  HEENT, cardiovascular, respiratory,  GI, GU, musculoskeletal, neuro, psychiatric, endocrine,  integumentary and hematologic systems were reviewed and are otherwise  negative/unremarkable except for positive findings mentioned above in the HPI.   MEDICATIONS AT HOME:   Prior to Admission medications   Medication Sig Start Date End Date Taking? Authorizing Provider  acetaminophen (TYLENOL) 325 MG tablet Take 2 tablets (650 mg total) by mouth every 6 (six) hours as needed. 03/15/18    Carrie Mew, MD  Black Cohosh 40 MG CAPS Take 40 mg by mouth daily.     [provider]  calcium carbonate (OSCAL) 1500 (600 Ca) MG TABS tablet Take 600 mg of elemental calcium by mouth 2 (two) times daily with a meal.     [provider]  chlorpheniramine-HYDROcodone (TUSSIONEX PENNKINETIC ER) 10-8 MG/5ML SUER Take 2.5 mLs by mouth every 12 (twelve) hours as needed for cough. 10/16/19   Robert Bellow, MD  lidocaine-prilocaine (EMLA) cream Apply 1 application topically as needed. 09/24/19   Cammie Sickle, MD  mirtazapine (REMERON) 15 MG tablet Take 1 tablet (15 mg total) by mouth at bedtime. 10/09/19   Cammie Sickle, MD      VITAL SIGNS:  Blood pressure (!) 152/79, pulse (!) 108, temperature 98.4 F (36.9 C), temperature source Oral, resp. rate (!) 23, height 5\' 3"  (1.6 m), weight 61.2 kg, SpO2 96 %.  PHYSICAL EXAMINATION:  Physical Exam  GENERAL:  79 y.o.-year-old Caucasian female patient lying in the bed with mild respiratory distress with conversational dyspnea. EYES: Pupils equal, round, reactive to light and accommodation. No scleral icterus. Extraocular muscles intact.  HEENT: Head atraumatic, normocephalic. Oropharynx and nasopharynx clear.  NECK:  Supple, no jugular venous distention. No thyroid enlargement, no tenderness.  LUNGS: Diminished bibasilar breath sounds with bibasal crackles. Next CARDIOVASCULAR: Regular rate and rhythm, S1, S2 normal. No murmurs, rubs, or gallops.  ABDOMEN: Soft, nondistended, nontender. Bowel sounds present. No organomegaly or mass.  EXTREMITIES: No pedal edema, cyanosis, or clubbing.  NEUROLOGIC: Cranial nerves II through XII are intact. Muscle strength 5/5 in all extremities. Sensation intact. Gait not checked.  PSYCHIATRIC: The patient is alert and oriented x 3.  Normal affect and good eye contact. SKIN: No obvious rash, lesion, or ulcer.   LABORATORY PANEL:   CBC Recent Labs  Lab 10/17/19 1335  WBC  3.6*  HGB 12.4  HCT 36.7  PLT 166   ------------------------------------------------------------------------------------------------------------------  Chemistries  Recent Labs  Lab 10/17/19 1335  NA 129*  K 3.1*  CL 101  CO2 19*  GLUCOSE 118*  BUN 20  CREATININE 0.75  CALCIUM 7.5*   ------------------------------------------------------------------------------------------------------------------  Cardiac Enzymes No results for input(s): TROPONINI in the last 168 hours. ------------------------------------------------------------------------------------------------------------------  RADIOLOGY:  DG Chest Portable 1 View  Result Date: 10/17/2019 CLINICAL DATA:  Positive COVID test EXAM: PORTABLE CHEST 1 VIEW COMPARISON:  09/26/2019, 08/25/2019 FINDINGS: Interstitial and ground-glass opacities at the bases superimposed on chronic lung scarring. No pleural effusion. Left-sided central venous catheter with tip over the SVC. Normal heart size. No pneumothorax. IMPRESSION: New interstitial and ground-glass opacity at the bases, concerning for pneumonia, possible atypical or viral pneumonia. Electronically Signed   By: Donavan Foil M.D.   On: 10/17/2019 21:20      IMPRESSION AND PLAN:   1.  Acute hypoxemic respiratory failure secondary to COVID-19. -The patient will be admitted to a medically monitored isolation bed. -O2 protocol will be followed to keep O2 saturation above 93.   2.  Multifocal pneumonia secondary to COVID-19. -The  patient will be admitted to an isolation monitored bed with droplet and contact precautions. -Given multifocal pneumonia we will empirically place the patient on IV Rocephin and Zithromax for possible bacterial superinfection only with elevated Procalcitonin. -The patient will be placed on scheduled Mucinex and as needed Tussionex. -We will avoid nebulization as much as we can, give bronchodilator MDI if needed, and with deterioration of oxygenation  try to avoid BiPAP/CPAP if possible.    -Will obtain sputum Gram stain culture and sensitivity and follow blood cultures. -O2 protocol will be followed. -We will follow CRP, ferritin, LDH and D-dimer. -Will follow manual differential for ANC/ALC ratio as well as follow troponin I and daily CBC with manual differential and CMP. - Will place the patient on IV Remdisivir and IV steroid therapy with Decadron with elevated inflammatory markers. - I discussed convalescent plasma benefits and risks with the patient who was agreeable to proceed with it. -The patient will be placed on vitamin D3, vitamin C, zinc sulfate, p.o. Pepcid and aspirin.  3.  Hypertension. -The patient will be placed on as needed IV labetalol.  4.  History of colon cancer status undergoing chemotherapy. -We will monitor for fever given mild leukopenia. -Procalcitonin is pending.  5. Possible UTI. -We will place the patient on IV Rocephin especially given positive nitrites and follow urine culture and sensitivity.  6.  DVT prophylaxis. -Subcutaneous Lovenox.   All the records are reviewed and case discussed with ED provider. The plan of care was discussed in details with the patient (and family). I answered all questions. The patient agreed to proceed with the above mentioned plan. Further management will depend upon hospital course.   CODE STATUS: Full code  TOTAL TIME TAKING CARE OF THIS PATIENT: 60 minutes.    Christel Mormon M.D on 10/17/2019 at 11:53 PM  [facility]   From 7 PM-7 AM, contact night-coverage [email or web address]  CC: Primary care physician; Cammie Sickle, MD   Note: This dictation was prepared with Dragon dictation along with smaller phrase technology. Any transcriptional errors that result from this process are unintentional.

## 2019-10-17 NOTE — ED Notes (Signed)
This nurse spoke with pt daughter and updated her on pt status with permission from pt. Pt daughter asked to be called and notified of any important updates that may occur.

## 2019-10-17 NOTE — ED Notes (Signed)
Patient's daughter called and updated by this RN

## 2019-10-17 NOTE — ED Triage Notes (Signed)
Says she almost passed out this am when her daughter was helping her up. Diagnosed with covid last thursday

## 2019-10-18 ENCOUNTER — Inpatient Hospital Stay: Payer: Medicare Other

## 2019-10-18 ENCOUNTER — Encounter: Payer: Self-pay | Admitting: Family Medicine

## 2019-10-18 DIAGNOSIS — R531 Weakness: Secondary | ICD-10-CM

## 2019-10-18 LAB — COMPREHENSIVE METABOLIC PANEL
ALT: 25 U/L (ref 0–44)
AST: 49 U/L — ABNORMAL HIGH (ref 15–41)
Albumin: 2.8 g/dL — ABNORMAL LOW (ref 3.5–5.0)
Alkaline Phosphatase: 47 U/L (ref 38–126)
Anion gap: 14 (ref 5–15)
BUN: 25 mg/dL — ABNORMAL HIGH (ref 8–23)
CO2: 19 mmol/L — ABNORMAL LOW (ref 22–32)
Calcium: 8.4 mg/dL — ABNORMAL LOW (ref 8.9–10.3)
Chloride: 97 mmol/L — ABNORMAL LOW (ref 98–111)
Creatinine, Ser: 0.82 mg/dL (ref 0.44–1.00)
GFR calc Af Amer: >60 mL/min (ref >60)
GFR calc non Af Amer: >60 mL/min (ref >60)
Glucose, Bld: 134 mg/dL — ABNORMAL HIGH (ref 70–99)
Potassium: 3.6 mmol/L (ref 3.5–5.1)
Sodium: 130 mmol/L — ABNORMAL LOW (ref 135–145)
Total Bilirubin: 0.8 mg/dL (ref 0.3–1.2)
Total Protein: 7.6 g/dL (ref 6.5–8.1)

## 2019-10-18 LAB — CBC WITH DIFFERENTIAL/PLATELET
Abs Immature Granulocytes: 0.02 10*3/uL (ref 0.00–0.07)
Basophils Absolute: 0 10*3/uL (ref 0.0–0.1)
Basophils Relative: 0 %
Eosinophils Absolute: 0 10*3/uL (ref 0.0–0.5)
Eosinophils Relative: 0 %
HCT: 37 % (ref 36.0–46.0)
Hemoglobin: 12.7 g/dL (ref 12.0–15.0)
Immature Granulocytes: 1 %
Lymphocytes Relative: 18 %
Lymphs Abs: 0.4 10*3/uL — ABNORMAL LOW (ref 0.7–4.0)
MCH: 28.9 pg (ref 26.0–34.0)
MCHC: 34.3 g/dL (ref 30.0–36.0)
MCV: 84.1 fL (ref 80.0–100.0)
Monocytes Absolute: 0.1 10*3/uL (ref 0.1–1.0)
Monocytes Relative: 3 %
Neutro Abs: 1.9 10*3/uL (ref 1.7–7.7)
Neutrophils Relative %: 78 %
Platelets: 168 10*3/uL (ref 150–400)
RBC: 4.4 MIL/uL (ref 3.87–5.11)
RDW: 14 % (ref 11.5–15.5)
Smear Review: NORMAL
WBC: 2.4 10*3/uL — ABNORMAL LOW (ref 4.0–10.5)
nRBC: 0 % (ref 0.0–0.2)

## 2019-10-18 LAB — URINALYSIS, COMPLETE (UACMP) WITH MICROSCOPIC
Bilirubin Urine: NEGATIVE
Glucose, UA: NEGATIVE mg/dL
Hgb urine dipstick: NEGATIVE
Ketones, ur: 20 mg/dL — AB
Leukocytes,Ua: NEGATIVE
Nitrite: POSITIVE — AB
Protein, ur: 100 mg/dL — AB
Specific Gravity, Urine: 1.027 (ref 1.005–1.030)
pH: 5 (ref 5.0–8.0)

## 2019-10-18 LAB — FIBRIN DERIVATIVES D-DIMER (ARMC ONLY)
Fibrin derivatives D-dimer (ARMC): 4485.87 ng/mL (FEU) — ABNORMAL HIGH (ref 0.00–499.00)
Fibrin derivatives D-dimer (ARMC): 4867.76 ng/mL (FEU) — ABNORMAL HIGH (ref 0.00–499.00)

## 2019-10-18 LAB — FERRITIN
Ferritin: 1228 ng/mL — ABNORMAL HIGH (ref 11–307)
Ferritin: 1359 ng/mL — ABNORMAL HIGH (ref 11–307)

## 2019-10-18 LAB — PROCALCITONIN: Procalcitonin: 0.61 ng/mL

## 2019-10-18 LAB — LACTATE DEHYDROGENASE: LDH: 483 U/L — ABNORMAL HIGH (ref 98–192)

## 2019-10-18 LAB — TROPONIN I (HIGH SENSITIVITY): Troponin I (High Sensitivity): 24 ng/L — ABNORMAL HIGH (ref <18)

## 2019-10-18 LAB — ABO/RH: ABO/RH(D): O POS

## 2019-10-18 LAB — C-REACTIVE PROTEIN
CRP: 21.5 mg/dL — ABNORMAL HIGH (ref <1.0)
CRP: 20.3 mg/dL — ABNORMAL HIGH (ref <1.0)

## 2019-10-18 MED ORDER — SODIUM CHLORIDE 0.9 % IV SOLN
1.0000 g | INTRAVENOUS | Status: DC
  Administered 2019-10-18 – 2019-10-20 (×3): 1 g via INTRAVENOUS
  Filled 2019-10-18 (×2): qty 1
  Filled 2019-10-18: qty 10
  Filled 2019-10-18: qty 1

## 2019-10-18 MED ORDER — AZITHROMYCIN 250 MG PO TABS
250.0000 mg | ORAL_TABLET | Freq: Every day | ORAL | Status: DC
  Administered 2019-10-18 – 2019-10-22 (×5): 250 mg via ORAL
  Filled 2019-10-18 (×5): qty 1

## 2019-10-18 MED ORDER — SODIUM CHLORIDE 0.9 % IV SOLN
100.0000 mg | Freq: Every day | INTRAVENOUS | Status: AC
  Administered 2019-10-19 – 2019-10-22 (×4): 100 mg via INTRAVENOUS
  Filled 2019-10-18 (×4): qty 100

## 2019-10-18 MED ORDER — IOHEXOL 350 MG/ML SOLN
75.0000 mL | Freq: Once | INTRAVENOUS | Status: AC | PRN
  Administered 2019-10-18: 75 mL via INTRAVENOUS

## 2019-10-18 NOTE — Progress Notes (Signed)
Respiratory rate in ED was 35 and when patient reached med surgery floor respiratory rate 36 with MEWS score 4

## 2019-10-18 NOTE — Progress Notes (Signed)
[facility] [place] NAME: Basha Harshman    MR#:  [id number]  DATE OF BIRTH:  Jan 02, 1941  SUBJECTIVE:    REVIEW OF SYSTEMS:   ROS Tolerating Diet: Tolerating PT:   DRUG ALLERGIES:  No Known Allergies  VITALS:  Blood pressure 116/67, pulse 94, temperature (!) 97.5 F (36.4 C), temperature source Oral, resp. rate (!) 25, height 5\' 3"  (1.6 m), weight 56.7 kg, SpO2 96 %.  PHYSICAL EXAMINATION:   Physical Exam  GENERAL:  79 y.o.-year-old patient lying in the bed with no acute distress.  EYES: Pupils equal, round, reactive to light and accommodation. No scleral icterus. Extraocular muscles intact.  HEENT: Head atraumatic, normocephalic. Oropharynx and nasopharynx clear.  NECK:  Supple, no jugular venous distention. No thyroid enlargement, no tenderness.  LUNGS: Normal breath sounds bilaterally, no wheezing, rales, rhonchi. No use of accessory muscles of respiration.  CARDIOVASCULAR: S1, S2 normal. No murmurs, rubs, or gallops.  ABDOMEN: Soft, nontender, nondistended. Bowel sounds present. No organomegaly or mass.  EXTREMITIES: No cyanosis, clubbing or edema b/l.    NEUROLOGIC: Cranial nerves II through XII are intact. No focal Motor or sensory deficits b/l.   PSYCHIATRIC:  patient is alert and oriented x 3.  SKIN: No obvious rash, lesion, or ulcer.   LABORATORY PANEL:  CBC Recent Labs  Lab 10/18/19 0254  WBC 2.4*  HGB 12.7  HCT 37.0  PLT 168    Chemistries  Recent Labs  Lab 10/18/19 0254  NA 130*  K 3.6  CL 97*  CO2 19*  GLUCOSE 134*  BUN 25*  CREATININE 0.82  CALCIUM 8.4*  AST 49*  ALT 25  ALKPHOS 47  BILITOT 0.8   Cardiac Enzymes No results for input(s): TROPONINI in the last 168 hours. RADIOLOGY:  CT ANGIO CHEST PE W OR WO CONTRAST  Result Date: 10/18/2019 CLINICAL DATA:  Shortness of breath, elevated D-dimer level. EXAM: CT ANGIOGRAPHY CHEST WITH CONTRAST TECHNIQUE: Multidetector CT imaging of the chest  was performed using the standard protocol during bolus administration of intravenous contrast. Multiplanar CT image reconstructions and MIPs were obtained to evaluate the vascular anatomy. CONTRAST:  38mL OMNIPAQUE IOHEXOL 350 MG/ML SOLN COMPARISON:  None. FINDINGS: Cardiovascular: Satisfactory opacification of the pulmonary arteries to the segmental level. No evidence of pulmonary embolism. Normal heart size. No pericardial effusion. Mediastinum/Nodes: No enlarged mediastinal, hilar, or axillary lymph nodes. Thyroid gland, trachea, and esophagus demonstrate no significant findings. Lungs/Pleura: No pneumothorax or pleural effusion is noted. Bilateral lower lobe airspace opacities are noted concerning for multifocal pneumonia. Mild right upper lobe opacity is noted as well. Upper Abdomen: No acute abnormality. Musculoskeletal: No chest wall abnormality. No acute or significant osseous findings. Review of the MIP images confirms the above findings. IMPRESSION: No definite evidence of pulmonary embolus. Multiple airspace opacities are noted bilaterally consistent with multifocal pneumonia. Electronically Signed   By: Marijo Conception M.D.   On: 10/18/2019 10:20   DG Chest Portable 1 View  Result Date: 10/17/2019 CLINICAL DATA:  Positive COVID test EXAM: PORTABLE CHEST 1 VIEW COMPARISON:  09/26/2019, 08/25/2019 FINDINGS: Interstitial and ground-glass opacities at the bases superimposed on chronic lung scarring. No pleural effusion. Left-sided central venous catheter with tip over the SVC. Normal heart size. No pneumothorax. IMPRESSION: New interstitial and ground-glass opacity at the bases, concerning for pneumonia, possible atypical or viral pneumonia. Electronically Signed   By: Donavan Foil M.D.   On: 10/17/2019 21:20   ASSESSMENT AND  PLAN:  Shakayla Hollrah  is a 79 y.o. Caucasian female with a known history of colon cancer on chemotherapy and hypertension who presented to the emergency room with acute onset of  dyspnea for the last couple weeks with associated cough that is mainly dry and occasionally productive of clear sputum as well as wheezing with no fever or chills. She denied any loss of taste or smell. Patient had a positive COVID-19 test on 10/11/2019.  1. Acute hypoxemic respiratory failure secondary to COVID-19. -patient will be admitted to a medically monitored isolation bed. -O2 protocol will be followed to keep O2 saturation above 93.  2. Multifocal pneumonia secondary to COVID-19. -Given multifocal pneumonia we will empirically place the patient on IV Rocephin  for possible bacterial superinfection only with elevated Procalcitonin. - on scheduled Mucinex and as needed Tussionex. -O2 protocol will be followed. -  on IV Remdisivir and IV steroid therapy with Decadron with elevated inflammatory markers. -  on vitamin D3, vitamin C, zinc sulfate, p.o. Pepcid and aspirin.  3.  Hypertension. -The patient will be placed on as needed IV labetalol.  4.  History of colon cancer status undergoing chemotherapy. -We will monitor for fever given mild leukopenia. -Recently got a dose of chemo  5. Possible UTI. -We will place the patient on IV Rocephin especially given positive nitrites and follow urine culture and sensitivity.  6.  DVT prophylaxis. -Subcutaneous Lovenox.    Procedures:none Family communication : patient and her daughter [name] on the phone Consults : Discharge Disposition : home CODE STATUS: full DVT Prophylaxis : Lovenox  TOTAL TIME TAKING CARE OF THIS PATIENT: *30* minutes.  >50% time spent on counselling and coordination of care  Note: This dictation was prepared with Dragon dictation along with smaller phrase technology. Any transcriptional errors that result from this process are unintentional.  Fritzi Mandes M.D on 10/18/2019 at 3:44 PM  Between 7am to 6pm - Pager - [phone]  After 6pm go to [email or web address]  [facility]   CC: Primary care  physician; Cammie Sickle, MDPatient ID: [name], female   DOB: [birth year], 79 y.o.   MRN: [id number]

## 2019-10-19 LAB — CBC WITH DIFFERENTIAL/PLATELET
Abs Immature Granulocytes: 0.03 10*3/uL (ref 0.00–0.07)
Basophils Absolute: 0 10*3/uL (ref 0.0–0.1)
Basophils Relative: 0 %
Eosinophils Absolute: 0 10*3/uL (ref 0.0–0.5)
Eosinophils Relative: 0 %
HCT: 36.3 % (ref 36.0–46.0)
Hemoglobin: 12.1 g/dL (ref 12.0–15.0)
Immature Granulocytes: 1 %
Lymphocytes Relative: 27 %
Lymphs Abs: 0.6 10*3/uL — ABNORMAL LOW (ref 0.7–4.0)
MCH: 28.7 pg (ref 26.0–34.0)
MCHC: 33.3 g/dL (ref 30.0–36.0)
MCV: 86 fL (ref 80.0–100.0)
Monocytes Absolute: 0.2 10*3/uL (ref 0.1–1.0)
Monocytes Relative: 10 %
Neutro Abs: 1.4 10*3/uL — ABNORMAL LOW (ref 1.7–7.7)
Neutrophils Relative %: 62 %
Platelets: 171 10*3/uL (ref 150–400)
RBC: 4.22 MIL/uL (ref 3.87–5.11)
RDW: 14.2 % (ref 11.5–15.5)
Smear Review: NORMAL
WBC: 2.1 10*3/uL — ABNORMAL LOW (ref 4.0–10.5)
nRBC: 0 % (ref 0.0–0.2)

## 2019-10-19 LAB — COMPREHENSIVE METABOLIC PANEL
ALT: 66 U/L — ABNORMAL HIGH (ref 0–44)
AST: 126 U/L — ABNORMAL HIGH (ref 15–41)
Albumin: 2.6 g/dL — ABNORMAL LOW (ref 3.5–5.0)
Alkaline Phosphatase: 50 U/L (ref 38–126)
Anion gap: 9 (ref 5–15)
BUN: 25 mg/dL — ABNORMAL HIGH (ref 8–23)
CO2: 20 mmol/L — ABNORMAL LOW (ref 22–32)
Calcium: 8.3 mg/dL — ABNORMAL LOW (ref 8.9–10.3)
Chloride: 107 mmol/L (ref 98–111)
Creatinine, Ser: 0.65 mg/dL (ref 0.44–1.00)
GFR calc Af Amer: >60 mL/min (ref >60)
GFR calc non Af Amer: >60 mL/min (ref >60)
Glucose, Bld: 144 mg/dL — ABNORMAL HIGH (ref 70–99)
Potassium: 3.8 mmol/L (ref 3.5–5.1)
Sodium: 136 mmol/L (ref 135–145)
Total Bilirubin: 0.5 mg/dL (ref 0.3–1.2)
Total Protein: 6.6 g/dL (ref 6.5–8.1)

## 2019-10-19 LAB — C-REACTIVE PROTEIN: CRP: 8.6 mg/dL — ABNORMAL HIGH (ref <1.0)

## 2019-10-19 LAB — FERRITIN: Ferritin: 1640 ng/mL — ABNORMAL HIGH (ref 11–307)

## 2019-10-19 LAB — FIBRIN DERIVATIVES D-DIMER (ARMC ONLY): Fibrin derivatives D-dimer (ARMC): 2947.09 ng/mL (FEU) — ABNORMAL HIGH (ref 0.00–499.00)

## 2019-10-19 MED ORDER — SODIUM CHLORIDE 0.9% FLUSH: 10.0000 mL | INTRAVENOUS | Status: DC | PRN

## 2019-10-19 MED ORDER — CHLORHEXIDINE GLUCONATE CLOTH 2 % EX PADS
6.0000 | MEDICATED_PAD | Freq: Every day | CUTANEOUS | Status: DC
  Administered 2019-10-19 – 2019-10-22 (×3): 6 via TOPICAL

## 2019-10-19 NOTE — Progress Notes (Signed)
[facility] [place] NAME: Courtney Jefferson    MR#:  [id number]  DATE OF BIRTH:  Feb 19, 1941  SUBJECTIVE:  patient out sitting in the chair. She feels little better today. Has been eating and drinking relatively better. No fever. Exertional shortness of breath.  REVIEW OF SYSTEMS:   Review of Systems  Constitutional: Negative for chills, fever and weight loss.  HENT: Negative for ear discharge, ear pain and nosebleeds.   Eyes: Negative for blurred vision, pain and discharge.  Respiratory: Positive for cough and shortness of breath. Negative for sputum production, wheezing and stridor.   Cardiovascular: Negative for chest pain, palpitations, orthopnea and PND.  Gastrointestinal: Negative for abdominal pain, diarrhea, nausea and vomiting.  Genitourinary: Negative for frequency and urgency.  Musculoskeletal: Negative for back pain and joint pain.  Neurological: Positive for weakness. Negative for sensory change, speech change and focal weakness.  Psychiatric/Behavioral: Negative for depression and hallucinations. The patient is not nervous/anxious.    Tolerating Diet:yes Tolerating PT: home health PT DRUG ALLERGIES:  No Known Allergies  VITALS:  Blood pressure 124/72, pulse 73, temperature 98.7 F (37.1 C), temperature source Oral, resp. rate 16, height 5\' 3"  (1.6 m), weight 56.7 kg, SpO2 94 %.  PHYSICAL EXAMINATION:   Physical Exam  GENERAL:  79 y.o.-year-old patient lying in the bed with no acute distress.  EYES: Pupils equal, round, reactive to light and accommodation. No scleral icterus.  HEENT: Head atraumatic, normocephalic. Oropharynx and nasopharynx clear.  NECK:  Supple, no jugular venous distention. No thyroid enlargement, no tenderness.  LUNGS: diminished breath sounds bilaterally basally, no wheezing, rales, rhonchi. No use of accessory muscles of respiration.  CARDIOVASCULAR: S1, S2 normal. No murmurs, rubs, or gallops.   ABDOMEN: Soft, nontender, nondistended. Bowel sounds present. No organomegaly or mass.  EXTREMITIES: No cyanosis, clubbing or edema b/l.    NEUROLOGIC: Cranial nerves II through XII are intact. No focal Motor or sensory deficits b/l.   PSYCHIATRIC:  patient is alert and oriented x 3.  SKIN: No obvious rash, lesion, or ulcer.   LABORATORY PANEL:  CBC Recent Labs  Lab 10/19/19 0505  WBC 2.1*  HGB 12.1  HCT 36.3  PLT 171    Chemistries  Recent Labs  Lab 10/19/19 0505  NA 136  K 3.8  CL 107  CO2 20*  GLUCOSE 144*  BUN 25*  CREATININE 0.65  CALCIUM 8.3*  AST 126*  ALT 66*  ALKPHOS 50  BILITOT 0.5   Cardiac Enzymes No results for input(s): TROPONINI in the last 168 hours. RADIOLOGY:  CT ANGIO CHEST PE W OR WO CONTRAST  Result Date: 10/18/2019 CLINICAL DATA:  Shortness of breath, elevated D-dimer level. EXAM: CT ANGIOGRAPHY CHEST WITH CONTRAST TECHNIQUE: Multidetector CT imaging of the chest was performed using the standard protocol during bolus administration of intravenous contrast. Multiplanar CT image reconstructions and MIPs were obtained to evaluate the vascular anatomy. CONTRAST:  96mL OMNIPAQUE IOHEXOL 350 MG/ML SOLN COMPARISON:  None. FINDINGS: Cardiovascular: Satisfactory opacification of the pulmonary arteries to the segmental level. No evidence of pulmonary embolism. Normal heart size. No pericardial effusion. Mediastinum/Nodes: No enlarged mediastinal, hilar, or axillary lymph nodes. Thyroid gland, trachea, and esophagus demonstrate no significant findings. Lungs/Pleura: No pneumothorax or pleural effusion is noted. Bilateral lower lobe airspace opacities are noted concerning for multifocal pneumonia. Mild right upper lobe opacity is noted as well. Upper Abdomen: No acute abnormality. Musculoskeletal: No chest wall abnormality. No acute or significant osseous  findings. Review of the MIP images confirms the above findings. IMPRESSION: No definite evidence of pulmonary  embolus. Multiple airspace opacities are noted bilaterally consistent with multifocal pneumonia. Electronically Signed   By: Marijo Conception M.D.   On: 10/18/2019 10:20   DG Chest Portable 1 View  Result Date: 10/17/2019 CLINICAL DATA:  Positive COVID test EXAM: PORTABLE CHEST 1 VIEW COMPARISON:  09/26/2019, 08/25/2019 FINDINGS: Interstitial and ground-glass opacities at the bases superimposed on chronic lung scarring. No pleural effusion. Left-sided central venous catheter with tip over the SVC. Normal heart size. No pneumothorax. IMPRESSION: New interstitial and ground-glass opacity at the bases, concerning for pneumonia, possible atypical or viral pneumonia. Electronically Signed   By: Donavan Foil M.D.   On: 10/17/2019 21:20   ASSESSMENT AND PLAN:  Courtney Jefferson  is a 79 y.o. Caucasian female with a known history of colon cancer on chemotherapy and hypertension who presented to the emergency room with acute onset of dyspnea for the last couple weeks with associated cough that is mainly dry and occasionally productive of clear sputum as well as wheezing with no fever or chills. She denied any loss of taste or smell. Patient had a positive COVID-19 test on 10/11/2019.  1. Acute hypoxemic respiratory failure secondary to COVID-19. -O2 protocol will be followed to keep O2 saturation above 93.  2. Multifocal pneumonia secondary to COVID-19. -Given multifocal pneumonia we will empirically place the patient on IV Rocephin  for possible bacterial superinfection  with elevated Procalcitonin. - on scheduled Mucinex and as needed Tussionex. -  on IV Remdisivir day 2/5 and IV steroid therapy with Decadron with elevated inflammatory markers. -CRP 21--8.6 -  on vitamin D3, vitamin C, zinc sulfate, p.o. Pepcid   3.  Hypertension. -The patient will be placed on as needed IV labetalol. -BP stable  4.  History of colon cancer status undergoing chemotherapy. -l monitor for fever given mild  leukopenia. -Recently got a dose of chemo  5. Acute  UTI., GNR on UC -IV Rocephin should cover.  6.  DVT prophylaxis. -Subcutaneous Lovenox.    Procedures:none Family communication : patient and her daughter [name] on the phone Consults : Discharge Disposition : home CODE STATUS: full DVT Prophylaxis : Lovenox  TOTAL TIME TAKING CARE OF THIS PATIENT: *30* minutes.  >50% time spent on counselling and coordination of care  Note: This dictation was prepared with Dragon dictation along with smaller phrase technology. Any transcriptional errors that result from this process are unintentional.  Fritzi Mandes M.D on 10/19/2019 at 2:26 PM  Between 7am to 6pm - Pager - [phone]  After 6pm go to [email or web address]  [facility]   CC: Primary care physician; Cammie Sickle, MDPatient ID: Courtney Jefferson, female   DOB: 1941-06-14, 79 y.o.   MRN: [id number]

## 2019-10-19 NOTE — Progress Notes (Signed)
SpO2 on 2L/min at rest = 93% SpO2 on room air while ambulating = 80% SpO2 on 4 liters of O2 while ambulating = 90%  Pt has no prior supplemental needs PTA for baseline IADL/ADL performance. Pt now requires O2 to maintain saturations WNL at rest and while performing basic in-room mobility.   12:35 PM, 10/19/19 Etta Grandchild, PT, DPT Physical Therapist - [facility]  [phone] [facility])

## 2019-10-19 NOTE — Care Management Important Message (Signed)
Important Message  Patient Details  Name: Courtney Jefferson MRN: [id number] Date of Birth: 04-Jul-1941   Medicare Important Message Given:  N/A - LOS <3 / Initial given by admissions  Initial Medicare IM given by Patient Access Associate on 10/18/2019 at 12:25am.  Still valid.   Dannette Barbara 10/19/2019, 1:18 PM

## 2019-10-19 NOTE — Evaluation (Signed)
Physical Therapy Evaluation Patient Details Name: Courtney Jefferson MRN: [id number] DOB: 1940/10/12 Today's Date: 10/19/2019   History of Present Illness  Courtney Jefferson is a 22yoF comes to [facility] c acute onset of dyspnea for the last couple weeks with associated cough.  Clinical Impression  Pt admitted with above diagnosis. Pt currently with functional limitations due to the deficits listed below (see "PT Problem List"). Upon entry, pt in bed, awake and agreeable to participate. The pt is alert and oriented x4, pleasant, conversational, and generally a good historian. Pt appears awake but fairly exhausted. Moderate effort to move to EOB, then moderate effort and use of BUE for self balance to STS and also to SPT to recliner. Transfers attempted on room air with SpO2 dropping to 80% pretty quickly. Additional AMB activity deferred until later date when adequate equipment is in room. Pt also limited heavily by coughing spells. Functional mobility assessment demonstrates increased effort/time requirements, poor tolerance, and need for physical assistance, whereas the patient performed these at a higher level of independence PTA. Pt living alone PTA, will definitely need some assistance at DC and will absolutely need supplemental O2 at DC. Pt reports like that she would be able to stay with her DTR at DC which should be confirmed for DC planning. Pt will benefit from skilled PT intervention to increase independence and safety with basic mobility in preparation for discharge to the venue listed below.       Follow Up Recommendations Home health PT    Equipment Recommendations  Rolling walker with 5" wheels    Recommendations for Other Services       Precautions / Restrictions Precautions Precautions: Fall Restrictions Weight Bearing Restrictions: No      Mobility  Bed Mobility Overal bed mobility: Modified Independent                Transfers Overall transfer level: Needs  assistance Equipment used: 1 person hand held assist Transfers: Sit to/from Stand;Stand Pivot Transfers Sit to Stand: Min guard Stand pivot transfers: Min assist       General transfer comment: hand support seems requisite at this time, a RW would improve independence and safety  Ambulation/Gait Ambulation/Gait assistance: (deferred d/t code yellow; lacking sufficicenty O2 tubing)              Stairs            Wheelchair Mobility    Modified Rankin (Stroke Patients Only)       Balance Overall balance assessment: Modified Independent;Mild deficits observed, not formally tested                                           Pertinent Vitals/Pain Pain Assessment: No/denies pain    Home Living Family/patient expects to be discharged to:: Private residence Living Arrangements: Alone Available Help at Discharge: Family(DTR lives nearby) Type of Home: House Home Access: Stairs to enter Entrance Stairs-Rails: None Technical brewer of Steps: 3 Home Layout: One level Home Equipment: None      Prior Function Level of Independence: Independent         Comments: Community dwelling adult, no DME use PTA, no falls, fully independent     Hand Dominance   Dominant Hand: Right    Extremity/Trunk Assessment   Upper Extremity Assessment Upper Extremity Assessment: Generalized weakness    Lower Extremity Assessment Lower Extremity Assessment: Generalized  weakness    Cervical / Trunk Assessment Cervical / Trunk Assessment: Normal  Communication   Communication: No difficulties(hypophonic, mildly)  Cognition Arousal/Alertness: Awake/alert Behavior During Therapy: WFL for tasks assessed/performed Overall Cognitive Status: Within Functional Limits for tasks assessed                                        General Comments      Exercises     Assessment/Plan    PT Assessment Patient needs continued PT services  PT  Problem List Decreased strength;Decreased activity tolerance;Decreased balance;Decreased mobility;Cardiopulmonary status limiting activity       PT Treatment Interventions DME instruction;Balance training;Gait training;Stair training;Functional mobility training;Therapeutic activities;Therapeutic exercise;Patient/family education    PT Goals (Current goals can be found in the Care Plan section)  Acute Rehab PT Goals Patient Stated Goal: Regain independence and stop coughing PT Goal Formulation: With patient Time For Goal Achievement: 11/02/19 Potential to Achieve Goals: Good    Frequency Min 2X/week   Barriers to discharge Inaccessible home environment;Decreased caregiver support      Co-evaluation               AM-PAC PT "6 Clicks" Mobility  Outcome Measure Help needed turning from your back to your side while in a flat bed without using bedrails?: A Little Help needed moving from lying on your back to sitting on the side of a flat bed without using bedrails?: A Little Help needed moving to and from a bed to a chair (including a wheelchair)?: A Little Help needed standing up from a chair using your arms (e.g., wheelchair or bedside chair)?: A Little Help needed to walk in hospital room?: A Little Help needed climbing 3-5 steps with a railing? : A Little 6 Click Score: 18    End of Session Equipment Utilized During Treatment: Oxygen Activity Tolerance: Patient tolerated treatment well;Treatment limited secondary to medical complications (Comment)(coughing spell relentless) Patient left: in chair;with call bell/phone within reach Nurse Communication: Mobility status PT Visit Diagnosis: Unsteadiness on feet (R26.81);Difficulty in walking, not elsewhere classified (R26.2);Other abnormalities of gait and mobility (R26.89);Repeated falls (R29.6);Muscle weakness (generalized) (M62.81);History of falling (Z91.81);Dizziness and giddiness (R42)    Time: [id number] PT Time  Calculation (min) (ACUTE ONLY): 30 min   Charges:   PT Evaluation $PT Eval Moderate Complexity: 1 Mod          12:31 PM, 10/19/19 Etta Grandchild, PT, DPT Physical Therapist - [facility]  [phone] ([place])    Megan Presti C 10/19/2019, 12:28 PM

## 2019-10-20 LAB — COMPREHENSIVE METABOLIC PANEL
ALT: 65 U/L — ABNORMAL HIGH (ref 0–44)
AST: 89 U/L — ABNORMAL HIGH (ref 15–41)
Albumin: 2.5 g/dL — ABNORMAL LOW (ref 3.5–5.0)
Alkaline Phosphatase: 53 U/L (ref 38–126)
Anion gap: 10 (ref 5–15)
BUN: 23 mg/dL (ref 8–23)
CO2: 21 mmol/L — ABNORMAL LOW (ref 22–32)
Calcium: 8.1 mg/dL — ABNORMAL LOW (ref 8.9–10.3)
Chloride: 107 mmol/L (ref 98–111)
Creatinine, Ser: 0.57 mg/dL (ref 0.44–1.00)
GFR calc Af Amer: >60 mL/min (ref >60)
GFR calc non Af Amer: >60 mL/min (ref >60)
Glucose, Bld: 116 mg/dL — ABNORMAL HIGH (ref 70–99)
Potassium: 2.8 mmol/L — ABNORMAL LOW (ref 3.5–5.1)
Sodium: 138 mmol/L (ref 135–145)
Total Bilirubin: 0.5 mg/dL (ref 0.3–1.2)
Total Protein: 6.4 g/dL — ABNORMAL LOW (ref 6.5–8.1)

## 2019-10-20 LAB — CBC WITH DIFFERENTIAL/PLATELET
Abs Immature Granulocytes: 0.02 10*3/uL (ref 0.00–0.07)
Basophils Absolute: 0 10*3/uL (ref 0.0–0.1)
Basophils Relative: 0 %
Eosinophils Absolute: 0 10*3/uL (ref 0.0–0.5)
Eosinophils Relative: 0 %
HCT: 36.5 % (ref 36.0–46.0)
Hemoglobin: 12.1 g/dL (ref 12.0–15.0)
Immature Granulocytes: 1 %
Lymphocytes Relative: 36 %
Lymphs Abs: 0.8 10*3/uL (ref 0.7–4.0)
MCH: 28.4 pg (ref 26.0–34.0)
MCHC: 33.2 g/dL (ref 30.0–36.0)
MCV: 85.7 fL (ref 80.0–100.0)
Monocytes Absolute: 0.2 10*3/uL (ref 0.1–1.0)
Monocytes Relative: 9 %
Neutro Abs: 1.2 10*3/uL — ABNORMAL LOW (ref 1.7–7.7)
Neutrophils Relative %: 54 %
Platelets: 186 10*3/uL (ref 150–400)
RBC: 4.26 MIL/uL (ref 3.87–5.11)
RDW: 14.3 % (ref 11.5–15.5)
Smear Review: NORMAL
WBC: 2.3 10*3/uL — ABNORMAL LOW (ref 4.0–10.5)
nRBC: 0 % (ref 0.0–0.2)

## 2019-10-20 LAB — URINE CULTURE: Culture: >=100000 — AB

## 2019-10-20 LAB — FERRITIN: Ferritin: 1460 ng/mL — ABNORMAL HIGH (ref 11–307)

## 2019-10-20 LAB — FIBRIN DERIVATIVES D-DIMER (ARMC ONLY): Fibrin derivatives D-dimer (ARMC): 2445.86 ng/mL (FEU) — ABNORMAL HIGH (ref 0.00–499.00)

## 2019-10-20 MED ORDER — SODIUM CHLORIDE 0.9 % IV SOLN
200.0000 mg | Freq: Once | INTRAVENOUS | Status: DC
  Filled 2019-10-20: qty 10

## 2019-10-20 MED ORDER — SODIUM CHLORIDE 0.9 % IV SOLN
INTRAVENOUS | Status: DC | PRN
  Administered 2019-10-20: 250 mL via INTRAVENOUS

## 2019-10-20 MED ORDER — POTASSIUM CHLORIDE CRYS ER 20 MEQ PO TBCR
40.0000 meq | EXTENDED_RELEASE_TABLET | ORAL | Status: AC
  Administered 2019-10-20 (×2): 40 meq via ORAL
  Filled 2019-10-20 (×2): qty 2

## 2019-10-20 MED ORDER — CEPHALEXIN 500 MG PO CAPS
500.0000 mg | ORAL_CAPSULE | Freq: Three times a day (TID) | ORAL | Status: DC
  Administered 2019-10-21 – 2019-10-22 (×5): 500 mg via ORAL
  Filled 2019-10-20 (×6): qty 1

## 2019-10-20 NOTE — Progress Notes (Signed)
Per telemety, Pt had a 12 beat run of PSVT. HR up to 130s. Pat back to NSR 76 HR. MD Posey Pronto made aware

## 2019-10-20 NOTE — Progress Notes (Signed)
Pharmacy Electrolyte Monitoring Consult:  Pharmacy consulted to assist in monitoring and replacing electrolytes in this 79 y.o. female admitted on 10/17/2019. Patient with history of colon cancer on chemotherapy. Patient with acute hypoxemic respiratory failure secondary to COVID-19.   Labs:  Sodium (mmol/L)  Date Value  10/20/2019 138  03/03/2012 141   Potassium (mmol/L)  Date Value  10/20/2019 2.8 (L)  03/03/2012 3.8   Magnesium (mg/dL)  Date Value  08/18/2019 2.5 (H)   Phosphorus (mg/dL)  Date Value  08/18/2019 3.2   Calcium (mg/dL)  Date Value  10/20/2019 8.1 (L)   Calcium, Total (mg/dL)  Date Value  03/03/2012 9.0   Albumin (g/dL)  Date Value  10/20/2019 2.5 (L)  03/03/2012 3.3 (L)    Assessment/Plan: Will order potassium 50mEq PO Q4hr x 2 doses. Will recheck potassium with am labs.   Pharmacy will continue to monitor and adjust per consult.   Lynnix Schoneman L 10/20/2019 10:50 AM

## 2019-10-20 NOTE — Progress Notes (Addendum)
[facility] NAME: Courtney [facility]    MR#:  [id number]  DATE OF BIRTH:  July 31, 1941  SUBJECTIVE:  patient reports eating some breakfast. She feels little better today. No fever. Exertional shortness of breath.  She is wondering if she will be getting her chemotherapy next week  REVIEW OF SYSTEMS:   Review of Systems  Constitutional: Positive for malaise/fatigue. Negative for chills, fever and weight loss.  HENT: Negative for ear discharge, ear pain and nosebleeds.   Eyes: Negative for blurred vision, pain and discharge.  Respiratory: Positive for cough and shortness of breath. Negative for sputum production, wheezing and stridor.   Cardiovascular: Negative for chest pain, palpitations, orthopnea and PND.  Gastrointestinal: Negative for abdominal pain, diarrhea, nausea and vomiting.  Genitourinary: Negative for frequency and urgency.  Musculoskeletal: Negative for back pain and joint pain.  Neurological: Positive for weakness. Negative for sensory change, speech change and focal weakness.  Psychiatric/Behavioral: Negative for depression and hallucinations. The patient is not nervous/anxious.    Tolerating Diet:yes Tolerating PT: home health PT DRUG ALLERGIES:  No Known Allergies  VITALS:  Blood pressure (!) 164/74, pulse 83, temperature (!) 97.5 F (36.4 C), temperature source Oral, resp. rate 18, height 5\' 3"  (1.6 m), weight 56.7 kg, SpO2 92 %.  PHYSICAL EXAMINATION:   Physical Exam  GENERAL:  79 y.o.-year-old patient lying in the bed with no acute distress.  EYES: Pupils equal, round, reactive to light and accommodation.  HEENT: Head atraumatic, normocephalic. Oropharynx and nasopharynx clear.  NECK:  Supple, no jugular venous distention. No thyroid enlargement, no tenderness.  LUNGS: diminished breath sounds bilaterally basally, no wheezing, rales, rhonchi. No use of accessory muscles of respiration.  CARDIOVASCULAR: S1, S2  normal. No murmurs, rubs, or gallops.  ABDOMEN: Soft, nontender, nondistended. Bowel sounds present. No organomegaly or mass. Colostomy+ EXTREMITIES: No clubbing or edema b/l.    NEUROLOGIC: Cranial nerves II through XII are intact. No focal Motor or sensory deficits b/l. Globally weak PSYCHIATRIC:  patient is alert and oriented x 3.  SKIN: No obvious rash, lesion, or ulcer.   LABORATORY PANEL:  CBC Recent Labs  Lab 10/20/19 0528  WBC 2.3*  HGB 12.1  HCT 36.5  PLT 186    Chemistries  Recent Labs  Lab 10/20/19 0528  NA 138  K 2.8*  CL 107  CO2 21*  GLUCOSE 116*  BUN 23  CREATININE 0.57  CALCIUM 8.1*  AST 89*  ALT 65*  ALKPHOS 53  BILITOT 0.5   Cardiac Enzymes No results for input(s): TROPONINI in the last 168 hours. RADIOLOGY:  No results found. ASSESSMENT AND PLAN:  Courtney [facility]  is a 79 y.o. Caucasian female with a known history of colon cancer on chemotherapy and hypertension who presented to the emergency room with acute onset of dyspnea for the last couple weeks with associated cough that is mainly dry. She denied any loss of taste or smell. Patient had a positive COVID-19 test on 10/11/2019.   1. Acute hypoxemic respiratory failure secondary to COVID-19. -O2 protocol will be followed to keep O2 saturation above 93. -Patient not in respiratory distress. Assess for home oxygen use  2. Multifocal pneumonia secondary to COVID-19. -Given multifocal pneumonia we will empirically place the patient on IV Rocephin and Zithromax for possible bacterial superinfection  with elevated Procalcitonin--change to oral abxs - on scheduled Mucinex and as needed Tussionex. -  on IV Remdisivir day 3/5 and IV steroid therapy  with Decadron with elevated inflammatory markers. -CRP 21--8.6 -  on vitamin D3, vitamin C, zinc sulfate, p.o. Pepcid   3.Hypertension. -The patient will be placed on as needed IV labetalol. -BP stable  4.History of colon cancer status undergoing  chemotherapy. - monitor for fever given mild leukopenia. -Recently got a dose of chemo -follows with Dr. Rogue [facility]  5. Acute E coli UTI -IV Rocephin should cover--change to po keflex  6. DVT prophylaxis. -Subcutaneous Lovenox.  7. Hypokalemia -pharmacy consult place for depleting potassium -could be due to G.I. losses with diarrhea yesterday  Anticipate discharged Monday if remains stable.  Procedures:none Family communication : patient and her daughter Courtney [facility] on the phone Consults : Discharge Disposition : home CODE STATUS: full DVT Prophylaxis : Lovenox  TOTAL TIME TAKING CARE OF THIS PATIENT: *30* minutes.  >50% time spent on counselling and coordination of care  Note: This dictation was prepared with Dragon dictation along with smaller phrase technology. Any transcriptional errors that result from this process are unintentional.  Courtney [facility] M.D on 10/20/2019 at 12:03 PM  Between 7am to 6pm - Pager - [phone]  CC: Primary care physician; Courtney [facility], MDPatient ID: Courtney [facility], female   DOB: [birth year], 79 y.o.   MRN: [id number]

## 2019-10-21 LAB — MAGNESIUM: Magnesium: 1.9 mg/dL (ref 1.7–2.4)

## 2019-10-21 LAB — C-REACTIVE PROTEIN
CRP: 7.7 mg/dL — ABNORMAL HIGH (ref <1.0)
CRP: 7.9 mg/dL — ABNORMAL HIGH (ref <1.0)

## 2019-10-21 LAB — POTASSIUM: Potassium: 4 mmol/L (ref 3.5–5.1)

## 2019-10-21 MED ORDER — DEXAMETHASONE 4 MG PO TABS
6.0000 mg | ORAL_TABLET | Freq: Every day | ORAL | Status: DC
  Administered 2019-10-21 – 2019-10-22 (×2): 6 mg via ORAL
  Filled 2019-10-21 (×3): qty 1.5

## 2019-10-21 NOTE — Progress Notes (Addendum)
[facility] at [place] NAME: Courtney [facility]    MR#:  [id number]  DATE OF BIRTH:  [birth year]  SUBJECTIVE:  patient reports eating some breakfast. Drank her OJ She feels little better today. No fever. Some Exertional shortness of breath.  REVIEW OF SYSTEMS:   Review of Systems  Constitutional: Positive for malaise/fatigue. Negative for chills, fever and weight loss.  HENT: Negative for ear discharge, ear pain and nosebleeds.   Eyes: Negative for blurred vision, pain and discharge.  Respiratory: Positive for cough and shortness of breath. Negative for sputum production, wheezing and stridor.   Cardiovascular: Negative for chest pain, palpitations, orthopnea and PND.  Gastrointestinal: Negative for abdominal pain, diarrhea, nausea and vomiting.  Genitourinary: Negative for frequency and urgency.  Musculoskeletal: Negative for back pain and joint pain.  Neurological: Positive for weakness. Negative for sensory change, speech change and focal weakness.  Psychiatric/Behavioral: Negative for depression and hallucinations. The patient is not nervous/anxious.    Tolerating Diet:yes Tolerating PT: home health PT DRUG ALLERGIES:  No Known Allergies  VITALS:  Blood pressure (!) 162/81, pulse 65, temperature 98.3 F (36.8 C), temperature source Oral, resp. rate 20, height 5\' 3"  (1.6 m), weight 56.7 kg, SpO2 97 %.  PHYSICAL EXAMINATION:   Physical Exam  GENERAL:  79 y.o.-year-old patient lying in the bed with no acute distress. Weak deconditioned EYES: Pupils equal, round, reactive to light and accommodation.  HEENT: Head atraumatic, normocephalic. Oropharynx and nasopharynx clear.  NECK:  Supple, no jugular venous distention. No thyroid enlargement, no tenderness.  LUNGS: diminished breath sounds bilaterally basally, no wheezing, rales, rhonchi. No use of accessory muscles of respiration.  CARDIOVASCULAR: S1, S2 normal. No murmurs, rubs, or gallops.   ABDOMEN: Soft, nontender, nondistended. Bowel sounds present. No organomegaly or mass. Colostomy + EXTREMITIES: No clubbing or edema b/l.    NEUROLOGIC: Cranial nerves II through XII are intact. No focal Motor or sensory deficits b/l. Globally weak PSYCHIATRIC:  patient is alert and oriented x 3.  SKIN: No obvious rash, lesion, or ulcer.   LABORATORY PANEL:  CBC Recent Labs  Lab 10/20/19 0528  WBC 2.3*  HGB 12.1  HCT 36.5  PLT 186    Chemistries  Recent Labs  Lab 10/20/19 0528 10/20/19 0528 10/21/19 0910  NA 138  --   --   K 2.8*   < > 4.0  CL 107  --   --   CO2 21*  --   --   GLUCOSE 116*  --   --   BUN 23  --   --   CREATININE 0.57  --   --   CALCIUM 8.1*  --   --   MG  --   --  1.9  AST 89*  --   --   ALT 65*  --   --   ALKPHOS 53  --   --   BILITOT 0.5  --   --    < > = values in this interval not displayed.   Cardiac Enzymes No results for input(s): TROPONINI in the last 168 hours. RADIOLOGY:  No results found. ASSESSMENT AND PLAN:  Courtney [facility]  is a 79 y.o. Caucasian female with a known history of colon cancer on chemotherapy and hypertension who presented to the emergency room with acute onset of dyspnea for the last couple weeks with associated cough that is mainly dry. She denied any loss of taste or smell. Patient had a  positive COVID-19 test on 10/11/2019.   1. Acute hypoxemic respiratory failure secondary to COVID-19. -O2 protocol will be followed to keep O2 saturation above 93. -Patient not in respiratory distress. Assess for home oxygen use  2. Multifocal pneumonia secondary to COVID-19. -Given multifocal pneumonia we will empirically place the patient on IV Rocephin and Zithromax for possible bacterial superinfection  with elevated Procalcitonin--change to oral abxs - on scheduled Mucinex and as needed Tussionex. -  on IV Remdisivir day 4/5 and IV steroid therapy with Decadron with elevated inflammatory markers. -CRP 21--8.6 -  on vitamin D3,  vitamin C, zinc sulfate, p.o. Pepcid   3.Hypertension. - as needed IV labetalol. -BP stable  4.History of colon cancer status undergoing chemotherapy. - monitor for fever given mild leukopenia. -Recently got a dose of chemo -follows with Dr. Rogue Bussing  5. Acute E coli UTI -IV Rocephin should cover--change to po keflex  6. DVT prophylaxis. -Subcutaneous Lovenox.  7. Hypokalemia -pharmacy consult place for depleting potassium -could be due to G.I. losses with diarrhea  -K 4.0  Anticipate discharged Monday if remains stable.  Procedures:none Family communication : patient and her daughter [name] on the phone Consults : Discharge Disposition : home with HHPT and oxygen CODE STATUS: full DVT Prophylaxis : Lovenox  TOTAL TIME TAKING CARE OF THIS PATIENT: *30* minutes.  >50% time spent on counselling and coordination of care  Note: This dictation was prepared with Dragon dictation along with smaller phrase technology. Any transcriptional errors that result from this process are unintentional.  Fritzi Mandes M.D on 10/21/2019 at 1:01 PM  Between 7am to 6pm - Pager - [phone]  CC: Primary care physician; Cammie Sickle, MDPatient ID: Courtney [facility], female   DOB: 1940/11/22, 79 y.o.   MRN: [id number]

## 2019-10-21 NOTE — Progress Notes (Signed)
Pharmacy Electrolyte Monitoring Consult:  Pharmacy consulted to assist in monitoring and replacing electrolytes in this 79 y.o. female admitted on 10/17/2019. Patient with history of colon cancer on chemotherapy. Patient with acute hypoxemic respiratory failure secondary to COVID-19.   Labs:  Sodium (mmol/L)  Date Value  10/20/2019 138  03/03/2012 141   Potassium (mmol/L)  Date Value  10/21/2019 4.0  03/03/2012 3.8   Magnesium (mg/dL)  Date Value  10/21/2019 1.9   Phosphorus (mg/dL)  Date Value  08/18/2019 3.2   Calcium (mg/dL)  Date Value  10/20/2019 8.1 (L)   Calcium, Total (mg/dL)  Date Value  03/03/2012 9.0   Albumin (g/dL)  Date Value  10/20/2019 2.5 (L)  03/03/2012 3.3 (L)    Assessment/Plan: Electrolytes WNL. No supplementation is warranted at this time.  Pharmacy will continue to monitor and adjust per consult.   Olivia Canter, [facility] 10/21/2019 10:10 AM

## 2019-10-22 DIAGNOSIS — R0602 Shortness of breath: Secondary | ICD-10-CM

## 2019-10-22 LAB — C-REACTIVE PROTEIN: CRP: 8.1 mg/dL — ABNORMAL HIGH (ref <1.0)

## 2019-10-22 LAB — POTASSIUM: Potassium: 4.3 mmol/L (ref 3.5–5.1)

## 2019-10-22 MED ORDER — CEPHALEXIN 500 MG PO CAPS: 500.0000 mg | ORAL_CAPSULE | Freq: Three times a day (TID) | ORAL | 0 refills | Status: AC

## 2019-10-22 MED ORDER — DEXAMETHASONE 6 MG PO TABS: 6.0000 mg | ORAL_TABLET | Freq: Every day | ORAL | 0 refills | Status: DC

## 2019-10-22 MED ORDER — GUAIFENESIN-DM 100-10 MG/5ML PO SYRP: 10.0000 mL | ORAL_SOLUTION | ORAL | 0 refills | Status: DC | PRN

## 2019-10-22 MED ORDER — HEPARIN SOD (PORK) LOCK FLUSH 100 UNIT/ML IV SOLN
500.0000 [IU] | Freq: Once | INTRAVENOUS | Status: AC
  Administered 2019-10-22: 16:00:00 500 [IU] via INTRAVENOUS
  Filled 2019-10-22: qty 5

## 2019-10-22 NOTE — Progress Notes (Signed)
SATURATION QUALIFICATIONS: (This note is used to comply with regulatory documentation for home oxygen)  Patient Saturations on Room Air at Rest = 88%  Patient Saturations on Room Air while Ambulating = 86%  Patient Saturations on 2 Liters of oxygen while Ambulating = 93%  Please briefly explain why patient needs home oxygen: 

## 2019-10-22 NOTE — Progress Notes (Addendum)
Audrea Muscat Lenig A and O X 4. VSS. Pt tolerating diet well. No complaints of pain or nausea. IV removed intact, prescriptions given. Pt voiced understanding of discharge instructions with no further questions. Pt discharged via wheelchair with NT.   Allergies as of 10/22/2019   No Known Allergies     Medication List    TAKE these medications   acetaminophen 325 MG tablet Commonly known as: TYLENOL Take 2 tablets (650 mg total) by mouth every 6 (six) hours as needed. What changed: reasons to take this   cephALEXin 500 MG capsule Commonly known as: KEFLEX Take 1 capsule (500 mg total) by mouth every 8 (eight) hours for 4 days.   chlorpheniramine-HYDROcodone 10-8 MG/5ML Suer Commonly known as: Tussionex Pennkinetic ER Take 2.5 mLs by mouth every 12 (twelve) hours as needed for cough.   dexamethasone 6 MG tablet Commonly known as: DECADRON Take 1 tablet (6 mg total) by mouth daily. Start taking on: October 23, 2019   guaiFENesin-dextromethorphan 100-10 MG/5ML syrup Commonly known as: ROBITUSSIN DM Take 10 mLs by mouth every 4 (four) hours as needed for cough.   lidocaine-prilocaine cream Commonly known as: EMLA Apply 1 application topically as needed. What changed: reasons to take this   mirtazapine 15 MG tablet Commonly known as: Remeron Take 1 tablet (15 mg total) by mouth at bedtime.            Durable Medical Equipment  (From admission, onward)         Start     Ordered   10/22/19 1051  DME Oxygen  Once    Question Answer Comment  Length of Need 6 Months   Mode or (Route) Nasal cannula   Liters per Minute 2   Frequency Continuous (stationary and portable oxygen unit needed)   Oxygen conserving device Yes   Oxygen delivery system Gas      10/22/19 1057   10/19/19 1426  For home use only DME Walker rolling  Once    Question Answer Comment  Walker: With 5 Inch Wheels   Patient needs a walker to treat with the following condition General weakness      10/19/19 1425          Vitals:   10/22/19 0453 10/22/19 1207  BP: (!) 160/76 (!) 141/81  Pulse: (!) 52 72  Resp: 20 18  Temp: 97.7 F (36.5 C) 97.9 F (36.6 C)  SpO2: 96% 93%    Julisa Flippo

## 2019-10-22 NOTE — TOC Initial Note (Addendum)
Transition of Care [facility]) - Initial/Assessment Note    Patient Details  Name: Courtney [place] MRN: [id number] Date of Birth: 1941-08-05  [facility]) CM/SW Contact:    Beverly Sessions, RN Phone Number: 10/22/2019, 1:02 PM  Clinical Narrative:                  Patient admitted with PNA due to covid  Assessment completed with daughter via phone Baseline daughter lives at home alone  At discharge she will be going to daughters home at [address] [place], [place].   PT has assessed patient and recommends home Health PT.  Daughter agreeable and states she does not have a preference of agency.  Patient was closed with [facility] on 10/08/19.  Referral made to [facility] with [place].  Daughter states they already have a brand new RW in the home  Referral for home O2 made to [facility] with [facility]  RNCM to provide portable pulse ox and thermometer.  Expected Discharge Plan: [place] Barriers to Discharge: No Barriers Identified   Patient Goals and CMS Choice     Choice offered to / list presented to : Adult Children  Expected Discharge Plan and Services Expected Discharge Plan: [place] Choice: [place] arrangements for the past 2 months: Single Family Home Expected Discharge Date: 10/22/19               DME Arranged: Oxygen, Pulse oximeter DME Agency: [employer] Date DME Agency Contacted: 10/22/19 Time DME Agency Contacted: 1301 Representative spoke with at DME Agency: [place]: PT [place]: [place] ([place]) Date [place]: 10/22/19 Time [place]: 1301 Representative spoke with at [place]: [name]  Prior Living Arrangements/Services Living arrangements for the past 2 months: [place] with:: Self Patient language and need for interpreter reviewed:: Yes Do you feel safe going back to the place where you  live?: Yes      Need for Family Participation in Patient Care: Yes (Comment) Care giver support system in place?: Yes (comment) Current home services: DME Criminal Activity/Legal Involvement Pertinent to Current Situation/Hospitalization: No - Comment as needed  Activities of Daily Living Home Assistive Devices/Equipment: None    Permission Sought/Granted                  Emotional Assessment       Orientation: : Oriented to Self, Oriented to Place, Oriented to  Time, Oriented to Situation   Psych Involvement: No (comment)  Admission diagnosis:  Shortness of breath [R06.02] Cancer of left colon (HCC) [C18.6] Generalized weakness [R53.1] Pneumonia due to COVID-19 virus [U07.1, J12.82] COVID-19 [U07.1] Patient Active Problem List   Diagnosis Date Noted  . Shortness of breath   . Generalized weakness   . Weakness   . Pneumonia due to COVID-19 virus 10/17/2019  . Cancer of left colon ([place]) 09/13/2019  . Goals of care, counseling/discussion 09/13/2019  . Diverticulitis large intestine w/o perforation or abscess w/o bleeding 08/25/2019  . Nausea with vomiting 08/25/2019  . Post-operative nausea and vomiting 08/25/2019  . Colonic obstruction ([place]) 08/17/2019  . Hypertension 05/03/2017  . 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 [place] [address] Phone: [phone] Fax: [phone]     Social Determinants of Health (SDOH) Interventions    Readmission Risk Interventions No flowsheet data found.

## 2019-10-22 NOTE — Progress Notes (Signed)
Pharmacy Electrolyte Monitoring Consult:  Pharmacy consulted to assist in monitoring and replacing electrolytes in this 79 y.o. female admitted on 10/17/2019. Patient with history of colon cancer on chemotherapy. Patient with acute hypoxemic respiratory failure secondary to COVID-19.   Labs:  Sodium (mmol/L)  Date Value  10/20/2019 138  03/03/2012 141   Potassium (mmol/L)  Date Value  10/22/2019 4.3  03/03/2012 3.8   Magnesium (mg/dL)  Date Value  10/21/2019 1.9   Phosphorus (mg/dL)  Date Value  08/18/2019 3.2   Calcium (mg/dL)  Date Value  10/20/2019 8.1 (L)   Calcium, Total (mg/dL)  Date Value  03/03/2012 9.0   Albumin (g/dL)  Date Value  10/20/2019 2.5 (L)  03/03/2012 3.3 (L)   Corrected Ca: 9.3 mg/dL  Assessment/Plan: Electrolytes WNL. No supplementation is warranted at this time.  Pharmacy will continue to monitor and adjust per consult.   Dallie Piles, [place] 10/22/2019 7:33 AM

## 2019-10-22 NOTE — Discharge Summary (Addendum)
[facility] NAME: Courtney Jefferson    MR#:  [id number]  DATE OF BIRTH:  1941-03-15  DATE OF ADMISSION:  10/17/2019 ADMITTING PHYSICIAN: Christel Mormon, MD  DATE OF DISCHARGE: 10/22/2019  PRIMARY CARE PHYSICIAN: Idelle Crouch, MD    ADMISSION DIAGNOSIS:  Shortness of breath [R06.02] Cancer of left colon (HCC) [C18.6] Generalized weakness [R53.1] Pneumonia due to COVID-19 virus [U07.1, J12.82] COVID-19 [U07.1]  DISCHARGE DIAGNOSIS:  acute hypoxic respiratory failure secondary to multifocal COVID pneumonia E. coli UTI/acute cystitis SECONDARY DIAGNOSIS:   Past Medical History:  Diagnosis Date  . Cancer [facility])    cancer of colon  . Hypertension   . PONV (postoperative nausea and vomiting)     HOSPITAL COURSE:   Courtney Jefferson a79 y.o.Caucasian femalewith a known history of colon cancer on chemotherapy and hypertension who presented to the emergency room with acute onset ofdyspnea for the last couple weeks with associated cough that is mainly dry. She denied any loss of taste or smell. Patient had a positive COVID-19 test on 10/11/2019.   1. Acute hypoxemic respiratory failure secondary to COVID-19. -O2 protocol will be followed to keep O2 saturation above 93. -Patient not in respiratory distress. Assess for home oxygen use prior to d/c  2. Multifocal pneumonia secondary to COVID-19. -Given multifocal pneumonia we will empirically give IV Rocephin and Zithromax for possible bacterial superinfection with elevated Procalcitonin--change to oral abxs   - on scheduled Mucinex and as needed Tussionex. -completed IV Remdisivir day 5/5 and IV steroid therapy with Decadron --change to oral decadron for 5 days -CRP 21--8.6 -  on vitamin D3, vitamin C, zinc sulfate, p.o. Pepcid  -- remains afebrile. Slowly improving.  3.Hypertension. - as needed IV labetalol. -BP stable  4.History of colon cancer status undergoing  chemotherapy. - monitor for fever given mild leukopenia. -Recently got a dose of chemo -follows with Dr. Rogue Bussing  5. Acute E coli UTI -IV Rocephin should cover--change to po keflex  6. DVT prophylaxis. -Subcutaneous Lovenox.  7. Hypokalemia -pharmacy consult place for depleting potassium -could be due to G.I. losses with diarrhea  -K 4.0  patient appears hemodynamically stable. She will discharge to home today with home health. Assess for home oxygen need prior to discharge  Procedures:none Family communication : patient and her daughter [name] on the phone Consults : Discharge Disposition : home with HHPT and possible oxygen CODE STATUS: full DVT Prophylaxis : Lovenox  CONSULTS OBTAINED:  Treatment Team:  Robert Bellow, MD  DRUG ALLERGIES:  No Known Allergies  DISCHARGE MEDICATIONS:   Allergies as of 10/22/2019   No Known Allergies     Medication List    TAKE these medications   acetaminophen 325 MG tablet Commonly known as: TYLENOL Take 2 tablets (650 mg total) by mouth every 6 (six) hours as needed. What changed: reasons to take this   cephALEXin 500 MG capsule Commonly known as: KEFLEX Take 1 capsule (500 mg total) by mouth every 8 (eight) hours for 4 days.   chlorpheniramine-HYDROcodone 10-8 MG/5ML Suer Commonly known as: Tussionex Pennkinetic ER Take 2.5 mLs by mouth every 12 (twelve) hours as needed for cough.   dexamethasone 6 MG tablet Commonly known as: DECADRON Take 1 tablet (6 mg total) by mouth daily. Start taking on: October 23, 2019   guaiFENesin-dextromethorphan 100-10 MG/5ML syrup Commonly known as: ROBITUSSIN DM Take 10 mLs by mouth every 4 (four) hours as needed for cough.   lidocaine-prilocaine cream  Commonly known as: EMLA Apply 1 application topically as needed. What changed: reasons to take this   mirtazapine 15 MG tablet Commonly known as: Remeron Take 1 tablet (15 mg total) by mouth at bedtime.             Durable Medical Equipment  (From admission, onward)         Start     Ordered   10/22/19 1051  DME Oxygen  Once    Question Answer Comment  Length of Need 6 Months   Mode or (Route) Nasal cannula   Liters per Minute 2   Frequency Continuous (stationary and portable oxygen unit needed)   Oxygen conserving device Yes   Oxygen delivery system Gas      10/22/19 1057   10/19/19 1426  For home use only DME Walker rolling  Once    Question Answer Comment  Walker: With 5 Inch Wheels   Patient needs a walker to treat with the following condition General weakness      10/19/19 1425          If you experience worsening of your admission symptoms, develop shortness of breath, life threatening emergency, suicidal or homicidal thoughts you must seek medical attention immediately by calling 911 or calling your MD immediately  if symptoms less severe.  You Must read complete instructions/literature along with all the possible adverse reactions/side effects for all the Medicines you take and that have been prescribed to you. Take any new Medicines after you have completely understood and accept all the possible adverse reactions/side effects.   Please note  You were cared for by a hospitalist during your hospital stay. If you have any questions about your discharge medications or the care you received while you were in the hospital after you are discharged, you can call the unit and asked to speak with the hospitalist on call if the hospitalist that took care of you is not available. Once you are discharged, your primary care physician will handle any further medical issues. Please note that NO REFILLS for any discharge medications will be authorized once you are discharged, as it is imperative that you return to your primary care physician (or establish a relationship with a primary care physician if you do not have one) for your aftercare needs so that they can reassess your need for medications  and monitor your lab values. Today   SUBJECTIVE   Some mild cough. No respiratory distress. Appears week  VITAL SIGNS:  Blood pressure (!) 160/76, pulse (!) 52, temperature 97.7 F (36.5 C), temperature source Oral, resp. rate 20, height 5\' 3"  (1.6 m), weight 56.7 kg, SpO2 96 %.  I/O:    Intake/Output Summary (Last 24 hours) at 10/22/2019 1058 Last data filed at 10/22/2019 0533 Gross per 24 hour  Intake --  Output 825 ml  Net -825 ml    PHYSICAL EXAMINATION:  GENERAL:  79 y.o.-year-old patient lying in the bed with no acute distress.  EYES: Pupils equal, round, reactive to light and accommodation. No scleral icterus.  HEENT: Head atraumatic, normocephalic. Oropharynx and nasopharynx clear.  NECK:  Supple, no jugular venous distention. No thyroid enlargement, no tenderness.  LUNGS: Normal breath sounds bilaterally, no wheezing, rales,rhonchi or crepitation. No use of accessory muscles of respiration.  CARDIOVASCULAR: S1, S2 normal. No murmurs, rubs, or gallops.  ABDOMEN: Soft, non-tender, non-distended. Bowel sounds present. No organomegaly or mass. Colostomy + EXTREMITIES: No pedal edema, cyanosis, or clubbing.  NEUROLOGIC: Cranial nerves II  through XII are intact. Muscle strength 5/5 in all extremities. Sensation intact. Gait not checked. weak PSYCHIATRIC: The patient is alert and oriented x 3.  SKIN: No obvious rash, lesion, or ulcer.   DATA REVIEW:   CBC  Recent Labs  Lab 10/20/19 0528  WBC 2.3*  HGB 12.1  HCT 36.5  PLT 186    Chemistries  Recent Labs  Lab 10/20/19 0528 10/20/19 0528 10/21/19 0910 10/21/19 0910 10/22/19 0639  NA 138  --   --   --   --   K 2.8*   < > 4.0   < > 4.3  CL 107  --   --   --   --   CO2 21*  --   --   --   --   GLUCOSE 116*  --   --   --   --   BUN 23  --   --   --   --   CREATININE 0.57  --   --   --   --   CALCIUM 8.1*  --   --   --   --   MG  --   --  1.9  --   --   AST 89*  --   --   --   --   ALT 65*  --   --   --   --    ALKPHOS 53  --   --   --   --   BILITOT 0.5  --   --   --   --    < > = values in this interval not displayed.    Microbiology Results   Recent Results (from the past 240 hour(s))  Urine Culture     Status: Abnormal   Collection Time: 10/18/19  6:29 AM   Specimen: Urine, Catheterized  Result Value Ref Range Status   Specimen Description   Final    URINE, CATHETERIZED Performed at [facility], [address] [place], [place] [address]    Special Requests   Final    NONE Performed at [facility], [place], [place] [address]    Culture >=100,000 COLONIES/mL ESCHERICHIA COLI (A)  Final   Report Status 10/20/2019 FINAL  Final   Organism ID, Bacteria ESCHERICHIA COLI (A)  Final      Susceptibility   Escherichia coli - MIC*    AMPICILLIN 8 SENSITIVE Sensitive     CEFAZOLIN <=4 SENSITIVE Sensitive     CEFTRIAXONE <=0.25 SENSITIVE Sensitive     CIPROFLOXACIN <=0.25 SENSITIVE Sensitive     GENTAMICIN <=1 SENSITIVE Sensitive     IMIPENEM <=0.25 SENSITIVE Sensitive     NITROFURANTOIN <=16 SENSITIVE Sensitive     TRIMETH/SULFA <=20 SENSITIVE Sensitive     AMPICILLIN/SULBACTAM 4 SENSITIVE Sensitive     PIP/TAZO <=4 SENSITIVE Sensitive     * >=100,000 COLONIES/mL ESCHERICHIA COLI    RADIOLOGY:  No results found.   CODE STATUS:     Code Status Orders  (From admission, onward)         Start     Ordered   10/17/19 2346  Full code  Continuous     10/17/19 2353        Code Status History    Date Active Date Inactive Code Status Order ID Comments User Context   08/25/2019 1313 08/29/2019 1936 Full Code [id number]  Robert Bellow, MD Inpatient   08/25/2019 0944 08/25/2019 1244 Full  Code [id number]  Robert Bellow, MD Outpatient   08/17/2019 2330 08/22/2019 2001 Full Code [id number]  Fredirick Maudlin, MD ED   Advance Care Planning Activity       TOTAL TIME TAKING CARE OF THIS PATIENT: *40* minutes.    Fritzi Mandes M.D on  10/22/2019 at 10:58 AM [facility]    CC: Primary care physician; Idelle Crouch, MD

## 2019-10-23 ENCOUNTER — Inpatient Hospital Stay: Payer: Medicare Other

## 2019-10-23 ENCOUNTER — Telehealth: Payer: Self-pay | Admitting: Internal Medicine

## 2019-10-23 ENCOUNTER — Inpatient Hospital Stay: Payer: Medicare Other | Admitting: Internal Medicine

## 2019-10-23 NOTE — Telephone Encounter (Signed)
On 1/18-spoke to patient's daughter [name]-regarding recent hospitalization for Covid pneumonia.  Patient likely being discharged on 1/18.  Recommend follow-up in the week of Jan 25th.  Understands that chemotherapy will be offered only if patient is recovered completely from Covid/back to baseline.

## 2019-10-25 ENCOUNTER — Inpatient Hospital Stay: Payer: Medicare Other

## 2019-10-29 ENCOUNTER — Telehealth: Payer: Self-pay | Admitting: *Deleted

## 2019-10-29 NOTE — Telephone Encounter (Signed)
My chart msg sent to pt/family re: r/s and cnl of patient's apts.

## 2019-10-29 NOTE — Telephone Encounter (Signed)
-----   Message from Wallene Dales sent at 10/29/2019  9:40 AM EST ----- Regarding: Chemo Treatment 1/26 Contact: [phone] Pt daughter would like cancel her appts for tomorrow due to patient just being released from hospital. She does not know when patient will be able to return. Please contact daughter to discuss.  Thank you Jerene Pitch

## 2019-10-30 ENCOUNTER — Inpatient Hospital Stay: Payer: Medicare Other

## 2019-10-30 ENCOUNTER — Inpatient Hospital Stay: Payer: Medicare Other | Admitting: Internal Medicine

## 2019-10-30 ENCOUNTER — Telehealth: Payer: Self-pay | Admitting: Internal Medicine

## 2019-10-30 NOTE — Telephone Encounter (Signed)
Spoke to patient daughter regarding the recent Covid infection.  Patient needing 5 L of oxygen/home meds at home.  Recommend contacting PCP/virtual visit with Dr. Doy Hutching.   Recommend holding chemotherapy/visit at cancer center.  Daughter will call us when patient is more stable from Covid standpoint-to resume visit at the cancer center/chemotherapy.

## 2019-10-31 ENCOUNTER — Emergency Department
Admission: EM | Admit: 2019-10-31 | Discharge: 2019-10-31 | Disposition: A | Discharge status: Other Acute Inpatient Hospital | Payer: Medicare Other | Attending: Emergency Medicine | Admitting: Emergency Medicine

## 2019-10-31 ENCOUNTER — Emergency Department: Payer: Medicare Other

## 2019-10-31 ENCOUNTER — Inpatient Hospital Stay (HOSPITAL_COMMUNITY): Payer: Medicare Other

## 2019-10-31 ENCOUNTER — Inpatient Hospital Stay (HOSPITAL_COMMUNITY)
Admission: AD | Admit: 2019-10-31 | Discharge: 2019-11-05 | DRG: 871 | Disposition: E | Payer: Medicare Other | Source: Other Acute Inpatient Hospital | Attending: Emergency Medicine | Admitting: Emergency Medicine

## 2019-10-31 ENCOUNTER — Other Ambulatory Visit: Payer: Self-pay | Admitting: Internal Medicine

## 2019-10-31 ENCOUNTER — Other Ambulatory Visit: Payer: Self-pay

## 2019-10-31 DIAGNOSIS — Z90710 Acquired absence of both cervix and uterus: Secondary | ICD-10-CM

## 2019-10-31 DIAGNOSIS — Z833 Family history of diabetes mellitus: Secondary | ICD-10-CM

## 2019-10-31 DIAGNOSIS — J9600 Acute respiratory failure, unspecified whether with hypoxia or hypercapnia: Secondary | ICD-10-CM | POA: Insufficient documentation

## 2019-10-31 DIAGNOSIS — Z85038 Personal history of other malignant neoplasm of large intestine: Secondary | ICD-10-CM | POA: Insufficient documentation

## 2019-10-31 DIAGNOSIS — E872 Acidosis: Secondary | ICD-10-CM | POA: Diagnosis present

## 2019-10-31 DIAGNOSIS — B948 Sequelae of other specified infectious and parasitic diseases: Secondary | ICD-10-CM

## 2019-10-31 DIAGNOSIS — J1282 Pneumonia due to coronavirus disease 2019: Secondary | ICD-10-CM | POA: Diagnosis not present

## 2019-10-31 DIAGNOSIS — J80 Acute respiratory distress syndrome: Secondary | ICD-10-CM | POA: Diagnosis present

## 2019-10-31 DIAGNOSIS — I10 Essential (primary) hypertension: Secondary | ICD-10-CM | POA: Diagnosis present

## 2019-10-31 DIAGNOSIS — Z933 Colostomy status: Secondary | ICD-10-CM | POA: Diagnosis not present

## 2019-10-31 DIAGNOSIS — J9383 Other pneumothorax: Secondary | ICD-10-CM | POA: Diagnosis present

## 2019-10-31 DIAGNOSIS — C189 Malignant neoplasm of colon, unspecified: Secondary | ICD-10-CM | POA: Diagnosis present

## 2019-10-31 DIAGNOSIS — Z66 Do not resuscitate: Secondary | ICD-10-CM | POA: Diagnosis present

## 2019-10-31 DIAGNOSIS — A419 Sepsis, unspecified organism: Secondary | ICD-10-CM | POA: Diagnosis present

## 2019-10-31 DIAGNOSIS — Z809 Family history of malignant neoplasm, unspecified: Secondary | ICD-10-CM

## 2019-10-31 DIAGNOSIS — C787 Secondary malignant neoplasm of liver and intrahepatic bile duct: Secondary | ICD-10-CM | POA: Diagnosis present

## 2019-10-31 DIAGNOSIS — C186 Malignant neoplasm of descending colon: Secondary | ICD-10-CM | POA: Diagnosis present

## 2019-10-31 DIAGNOSIS — E876 Hypokalemia: Secondary | ICD-10-CM | POA: Diagnosis present

## 2019-10-31 DIAGNOSIS — I319 Disease of pericardium, unspecified: Secondary | ICD-10-CM | POA: Diagnosis not present

## 2019-10-31 DIAGNOSIS — Z87891 Personal history of nicotine dependence: Secondary | ICD-10-CM

## 2019-10-31 DIAGNOSIS — R0602 Shortness of breath: Secondary | ICD-10-CM

## 2019-10-31 DIAGNOSIS — Z9221 Personal history of antineoplastic chemotherapy: Secondary | ICD-10-CM | POA: Diagnosis not present

## 2019-10-31 DIAGNOSIS — J9601 Acute respiratory failure with hypoxia: Secondary | ICD-10-CM | POA: Diagnosis present

## 2019-10-31 DIAGNOSIS — J939 Pneumothorax, unspecified: Secondary | ICD-10-CM | POA: Diagnosis not present

## 2019-10-31 DIAGNOSIS — Z978 Presence of other specified devices: Secondary | ICD-10-CM

## 2019-10-31 DIAGNOSIS — U071 COVID-19: Secondary | ICD-10-CM

## 2019-10-31 DIAGNOSIS — L8990 Pressure ulcer of unspecified site, unspecified stage: Secondary | ICD-10-CM | POA: Insufficient documentation

## 2019-10-31 DIAGNOSIS — Z515 Encounter for palliative care: Secondary | ICD-10-CM | POA: Diagnosis not present

## 2019-10-31 LAB — CBC WITH DIFFERENTIAL/PLATELET
Abs Immature Granulocytes: 0.25 10*3/uL — ABNORMAL HIGH (ref 0.00–0.07)
Abs Immature Granulocytes: 0.3 10*3/uL — ABNORMAL HIGH (ref 0.00–0.07)
Basophils Absolute: 0.1 10*3/uL (ref 0.0–0.1)
Basophils Absolute: 0.1 10*3/uL (ref 0.0–0.1)
Basophils Relative: 0 %
Basophils Relative: 0 %
Eosinophils Absolute: 0 10*3/uL (ref 0.0–0.5)
Eosinophils Absolute: 0 10*3/uL (ref 0.0–0.5)
Eosinophils Relative: 0 %
Eosinophils Relative: 0 %
HCT: 44.7 % (ref 36.0–46.0)
HCT: 41 % (ref 36.0–46.0)
Hemoglobin: 14.3 g/dL (ref 12.0–15.0)
Hemoglobin: 13.1 g/dL (ref 12.0–15.0)
Immature Granulocytes: 2 %
Immature Granulocytes: 1 %
Lymphocytes Relative: 9 %
Lymphocytes Relative: 3 %
Lymphs Abs: 1.6 10*3/uL (ref 0.7–4.0)
Lymphs Abs: 0.8 10*3/uL (ref 0.7–4.0)
MCH: 28.4 pg (ref 26.0–34.0)
MCH: 29.3 pg (ref 26.0–34.0)
MCHC: 32 g/dL (ref 30.0–36.0)
MCHC: 32 g/dL (ref 30.0–36.0)
MCV: 88.7 fL (ref 80.0–100.0)
MCV: 91.7 fL (ref 80.0–100.0)
Monocytes Absolute: 1.2 10*3/uL — ABNORMAL HIGH (ref 0.1–1.0)
Monocytes Absolute: 0.4 10*3/uL (ref 0.1–1.0)
Monocytes Relative: 7 %
Monocytes Relative: 2 %
Neutro Abs: 14 10*3/uL — ABNORMAL HIGH (ref 1.7–7.7)
Neutro Abs: 21.7 10*3/uL — ABNORMAL HIGH (ref 1.7–7.7)
Neutrophils Relative %: 82 %
Neutrophils Relative %: 94 %
Platelets: 326 10*3/uL (ref 150–400)
Platelets: 223 10*3/uL (ref 150–400)
RBC: 5.04 MIL/uL (ref 3.87–5.11)
RBC: 4.47 MIL/uL (ref 3.87–5.11)
RDW: 15 % (ref 11.5–15.5)
RDW: 15 % (ref 11.5–15.5)
WBC: 17.1 10*3/uL — ABNORMAL HIGH (ref 4.0–10.5)
WBC: 23.2 10*3/uL — ABNORMAL HIGH (ref 4.0–10.5)
nRBC: 0 % (ref 0.0–0.2)
nRBC: 0 % (ref 0.0–0.2)

## 2019-10-31 LAB — C-REACTIVE PROTEIN: CRP: 7.2 mg/dL — ABNORMAL HIGH (ref <1.0)

## 2019-10-31 LAB — POCT I-STAT 7, (LYTES, BLD GAS, ICA,H+H)
Acid-base deficit: 4 mmol/L — ABNORMAL HIGH (ref 0.0–2.0)
Bicarbonate: 23 mmol/L (ref 20.0–28.0)
Calcium, Ion: 1.24 mmol/L (ref 1.15–1.40)
HCT: 33 % — ABNORMAL LOW (ref 36.0–46.0)
Hemoglobin: 11.2 g/dL — ABNORMAL LOW (ref 12.0–15.0)
O2 Saturation: 88 %
Patient temperature: 98.9
Potassium: 4.6 mmol/L (ref 3.5–5.1)
Sodium: 138 mmol/L (ref 135–145)
TCO2: 25 mmol/L (ref 22–32)
pCO2 arterial: 53 mmHg — ABNORMAL HIGH (ref 32.0–48.0)
pH, Arterial: 7.247 — ABNORMAL LOW (ref 7.350–7.450)
pO2, Arterial: 64 mmHg — ABNORMAL LOW (ref 83.0–108.0)

## 2019-10-31 LAB — COMPREHENSIVE METABOLIC PANEL
ALT: 54 U/L — ABNORMAL HIGH (ref 0–44)
ALT: 52 U/L — ABNORMAL HIGH (ref 0–44)
AST: 42 U/L — ABNORMAL HIGH (ref 15–41)
AST: 38 U/L (ref 15–41)
Albumin: 2.8 g/dL — ABNORMAL LOW (ref 3.5–5.0)
Albumin: 2.6 g/dL — ABNORMAL LOW (ref 3.5–5.0)
Alkaline Phosphatase: 64 U/L (ref 38–126)
Alkaline Phosphatase: 63 U/L (ref 38–126)
Anion gap: 10 (ref 5–15)
Anion gap: 11 (ref 5–15)
BUN: 19 mg/dL (ref 8–23)
BUN: 16 mg/dL (ref 8–23)
CO2: 25 mmol/L (ref 22–32)
CO2: 22 mmol/L (ref 22–32)
Calcium: 9.1 mg/dL (ref 8.9–10.3)
Calcium: 8.3 mg/dL — ABNORMAL LOW (ref 8.9–10.3)
Chloride: 102 mmol/L (ref 98–111)
Chloride: 105 mmol/L (ref 98–111)
Creatinine, Ser: 0.71 mg/dL (ref 0.44–1.00)
Creatinine, Ser: 0.56 mg/dL (ref 0.44–1.00)
GFR calc Af Amer: >60 mL/min (ref >60)
GFR calc Af Amer: >60 mL/min (ref >60)
GFR calc non Af Amer: >60 mL/min (ref >60)
GFR calc non Af Amer: >60 mL/min (ref >60)
Glucose, Bld: 148 mg/dL — ABNORMAL HIGH (ref 70–99)
Glucose, Bld: 154 mg/dL — ABNORMAL HIGH (ref 70–99)
Potassium: 3.9 mmol/L (ref 3.5–5.1)
Potassium: 4.7 mmol/L (ref 3.5–5.1)
Sodium: 137 mmol/L (ref 135–145)
Sodium: 138 mmol/L (ref 135–145)
Total Bilirubin: 1 mg/dL (ref 0.3–1.2)
Total Bilirubin: 1.1 mg/dL (ref 0.3–1.2)
Total Protein: 7.6 g/dL (ref 6.5–8.1)
Total Protein: 6.6 g/dL (ref 6.5–8.1)

## 2019-10-31 LAB — BLOOD GAS, ARTERIAL
Acid-base deficit: 3.8 mmol/L — ABNORMAL HIGH (ref 0.0–2.0)
Bicarbonate: 21.5 mmol/L (ref 20.0–28.0)
FIO2: 1
MECHVT: 450 mL
Mechanical Rate: 24
O2 Saturation: 90.5 %
PEEP: 12 cmH2O
Patient temperature: 37
RATE: 24 resp/min
pCO2 arterial: 39 mmHg (ref 32.0–48.0)
pH, Arterial: 7.35 (ref 7.350–7.450)
pO2, Arterial: 63 mmHg — ABNORMAL LOW (ref 83.0–108.0)

## 2019-10-31 LAB — TYPE AND SCREEN
ABO/RH(D): O POS
Antibody Screen: NEGATIVE

## 2019-10-31 LAB — D-DIMER, QUANTITATIVE: D-Dimer, Quant: 8.69 ug/mL-FEU — ABNORMAL HIGH (ref 0.00–0.50)

## 2019-10-31 LAB — FIBRINOGEN
Fibrinogen: 578 mg/dL — ABNORMAL HIGH (ref 210–475)
Fibrinogen: 585 mg/dL — ABNORMAL HIGH (ref 210–475)

## 2019-10-31 LAB — TRIGLYCERIDES: Triglycerides: 128 mg/dL (ref <150)

## 2019-10-31 LAB — LACTIC ACID, PLASMA
Lactic Acid, Venous: 1.8 mmol/L (ref 0.5–1.9)
Lactic Acid, Venous: 2.3 mmol/L (ref 0.5–1.9)
Lactic Acid, Venous: 3 mmol/L (ref 0.5–1.9)

## 2019-10-31 LAB — FERRITIN: Ferritin: 341 ng/mL — ABNORMAL HIGH (ref 11–307)

## 2019-10-31 LAB — PROCALCITONIN: Procalcitonin: <0.1 ng/mL

## 2019-10-31 LAB — FIBRIN DERIVATIVES D-DIMER (ARMC ONLY): Fibrin derivatives D-dimer (ARMC): 7132.42 ng/mL (FEU) — ABNORMAL HIGH (ref 0.00–499.00)

## 2019-10-31 LAB — LACTATE DEHYDROGENASE: LDH: 354 U/L — ABNORMAL HIGH (ref 98–192)

## 2019-10-31 MED ORDER — FENTANYL CITRATE (PF) 100 MCG/2ML IJ SOLN
50.0000 ug | INTRAMUSCULAR | Status: DC | PRN
  Administered 2019-10-31: 100 ug via INTRAVENOUS
  Administered 2019-10-31: 50 ug via INTRAVENOUS
  Administered 2019-10-31: 100 ug via INTRAVENOUS

## 2019-10-31 MED ORDER — ETOMIDATE 2 MG/ML IV SOLN
15.0000 mg | Freq: Once | INTRAVENOUS | Status: AC
  Administered 2019-10-31: 15 mg via INTRAVENOUS

## 2019-10-31 MED ORDER — VANCOMYCIN HCL 750 MG/150ML IV SOLN
750.0000 mg | INTRAVENOUS | Status: DC
  Administered 2019-11-01 – 2019-11-02 (×2): 750 mg via INTRAVENOUS
  Filled 2019-10-31 (×3): qty 150

## 2019-10-31 MED ORDER — IPRATROPIUM-ALBUTEROL 0.5-2.5 (3) MG/3ML IN SOLN
3.0000 mL | Freq: Once | RESPIRATORY_TRACT | Status: AC
  Administered 2019-10-31: 3 mL via RESPIRATORY_TRACT
  Filled 2019-10-31: qty 3

## 2019-10-31 MED ORDER — SODIUM CHLORIDE 0.9 % IV SOLN
2.0000 g | Freq: Once | INTRAVENOUS | Status: AC
  Administered 2019-10-31: 2 g via INTRAVENOUS
  Filled 2019-10-31: qty 2

## 2019-10-31 MED ORDER — HEPARIN SODIUM (PORCINE) 5000 UNIT/ML IJ SOLN
5000.0000 [IU] | Freq: Three times a day (TID) | INTRAMUSCULAR | Status: DC
  Administered 2019-10-31 – 2019-11-01 (×2): 5000 [IU] via SUBCUTANEOUS
  Filled 2019-10-31 (×2): qty 1

## 2019-10-31 MED ORDER — DEXAMETHASONE SODIUM PHOSPHATE 10 MG/ML IJ SOLN
6.0000 mg | INTRAMUSCULAR | Status: DC
  Administered 2019-11-01 – 2019-11-03 (×3): 6 mg via INTRAVENOUS
  Filled 2019-10-31 (×3): qty 1

## 2019-10-31 MED ORDER — LACTATED RINGERS IV BOLUS
500.0000 mL | Freq: Once | INTRAVENOUS | Status: AC
  Administered 2019-10-31: 500 mL via INTRAVENOUS

## 2019-10-31 MED ORDER — SODIUM CHLORIDE 0.9 % IV SOLN
2.0000 g | Freq: Two times a day (BID) | INTRAVENOUS | Status: DC
  Administered 2019-11-01 – 2019-11-03 (×6): 2 g via INTRAVENOUS
  Filled 2019-10-31 (×6): qty 2

## 2019-10-31 MED ORDER — VECURONIUM BROMIDE 10 MG IV SOLR
INTRAVENOUS | Status: AC
  Administered 2019-10-31: 5.6 mg via INTRAVENOUS
  Filled 2019-10-31: qty 10

## 2019-10-31 MED ORDER — FENTANYL 2500MCG IN NS 250ML (10MCG/ML) PREMIX INFUSION
25.0000 ug/h | INTRAVENOUS | Status: DC
  Administered 2019-10-31: 50 ug/h via INTRAVENOUS
  Filled 2019-10-31: qty 250

## 2019-10-31 MED ORDER — FENTANYL BOLUS VIA INFUSION
25.0000 ug | INTRAVENOUS | Status: DC | PRN
  Administered 2019-10-31 – 2019-11-01 (×7): 25 ug via INTRAVENOUS
  Filled 2019-10-31: qty 25

## 2019-10-31 MED ORDER — PROPOFOL 1000 MG/100ML IV EMUL
5.0000 ug/kg/min | INTRAVENOUS | Status: DC
  Administered 2019-10-31 (×2): 20 ug/kg/min via INTRAVENOUS
  Filled 2019-10-31: qty 100

## 2019-10-31 MED ORDER — SODIUM CHLORIDE 0.9 % IV SOLN: 100.0000 mg | Freq: Every day | INTRAVENOUS | Status: DC

## 2019-10-31 MED ORDER — SODIUM CHLORIDE 0.9 % IV SOLN
200.0000 mg | Freq: Once | INTRAVENOUS | Status: AC
  Administered 2019-10-31: 200 mg via INTRAVENOUS
  Filled 2019-10-31: qty 40

## 2019-10-31 MED ORDER — PROPOFOL 1000 MG/100ML IV EMUL
0.0000 ug/kg/min | INTRAVENOUS | Status: DC
  Administered 2019-10-31: 40 ug/kg/min via INTRAVENOUS
  Administered 2019-11-01: 30 ug/kg/min via INTRAVENOUS
  Administered 2019-11-01: 40 ug/kg/min via INTRAVENOUS
  Administered 2019-11-02: 05:00:00 30 ug/kg/min via INTRAVENOUS
  Filled 2019-10-31 (×4): qty 100

## 2019-10-31 MED ORDER — DOCUSATE SODIUM 50 MG/5ML PO LIQD: 100.0000 mg | Freq: Two times a day (BID) | ORAL | Status: DC | PRN

## 2019-10-31 MED ORDER — PROPOFOL 1000 MG/100ML IV EMUL
INTRAVENOUS | Status: AC
  Administered 2019-10-31: 10 ug/kg/min via INTRAVENOUS
  Filled 2019-10-31: qty 100

## 2019-10-31 MED ORDER — VECURONIUM BROMIDE 10 MG IV SOLR: 0.1000 mg/kg | Freq: Once | INTRAVENOUS | Status: AC

## 2019-10-31 MED ORDER — FENTANYL CITRATE (PF) 100 MCG/2ML IJ SOLN
50.0000 ug | INTRAMUSCULAR | Status: DC | PRN
  Administered 2019-10-31: 50 ug via INTRAVENOUS

## 2019-10-31 MED ORDER — FENTANYL CITRATE (PF) 100 MCG/2ML IJ SOLN
50.0000 ug | Freq: Once | INTRAMUSCULAR | Status: AC
  Administered 2019-10-31: 50 ug via INTRAVENOUS
  Filled 2019-10-31: qty 2

## 2019-10-31 MED ORDER — VECURONIUM BROMIDE 10 MG IV SOLR
0.1000 mg/kg | Freq: Once | INTRAVENOUS | Status: AC
  Administered 2019-10-31: 5.6 mg via INTRAVENOUS

## 2019-10-31 MED ORDER — FENTANYL 2500MCG IN NS 250ML (10MCG/ML) PREMIX INFUSION
0.0000 ug/h | INTRAVENOUS | Status: DC
  Administered 2019-10-31: 75 ug/h via INTRAVENOUS
  Administered 2019-11-01 – 2019-11-02 (×2): 100 ug/h via INTRAVENOUS
  Administered 2019-11-03: 200 ug/h via INTRAVENOUS
  Administered 2019-11-03: 400 ug/h via INTRAVENOUS
  Filled 2019-10-31 (×4): qty 250

## 2019-10-31 MED ORDER — FENTANYL CITRATE (PF) 100 MCG/2ML IJ SOLN: 25.0000 ug | Freq: Once | INTRAMUSCULAR | Status: DC

## 2019-10-31 MED ORDER — FAMOTIDINE IN NACL 20-0.9 MG/50ML-% IV SOLN
20.0000 mg | Freq: Two times a day (BID) | INTRAVENOUS | Status: DC
  Administered 2019-10-31 – 2019-11-03 (×6): 20 mg via INTRAVENOUS
  Filled 2019-10-31 (×6): qty 50

## 2019-10-31 MED ORDER — FENTANYL BOLUS VIA INFUSION
25.0000 ug | INTRAVENOUS | Status: DC | PRN
  Filled 2019-10-31: qty 25

## 2019-10-31 MED ORDER — IOHEXOL 350 MG/ML SOLN
75.0000 mL | Freq: Once | INTRAVENOUS | Status: AC | PRN
  Administered 2019-10-31: 75 mL via INTRAVENOUS

## 2019-10-31 MED ORDER — VECURONIUM BROMIDE 10 MG IV SOLR
INTRAVENOUS | Status: AC
  Filled 2019-10-31: qty 10

## 2019-10-31 MED ORDER — SUCCINYLCHOLINE CHLORIDE 20 MG/ML IJ SOLN
90.0000 mg | Freq: Once | INTRAMUSCULAR | Status: AC
  Administered 2019-10-31: 90 mg via INTRAVENOUS

## 2019-10-31 MED ORDER — VANCOMYCIN HCL 1250 MG/250ML IV SOLN
1250.0000 mg | Freq: Once | INTRAVENOUS | Status: AC
  Administered 2019-10-31: 1250 mg via INTRAVENOUS
  Filled 2019-10-31: qty 250

## 2019-10-31 MED ORDER — DEXAMETHASONE SODIUM PHOSPHATE 10 MG/ML IJ SOLN
10.0000 mg | Freq: Once | INTRAMUSCULAR | Status: AC
  Administered 2019-10-31: 10 mg via INTRAVENOUS
  Filled 2019-10-31: qty 1

## 2019-10-31 NOTE — Progress Notes (Signed)
Patient arrival to [facility] at [phone]. Accompanied by EMT and RN. Per EMT Rowland Lathe patient sustained a left temple injury from an alaris pump channel detaching from the IV pump during transfer. There is a visible non-bleeding lesion noted on the left side of her temple. Slight discoloration noted underneath the lesion. Focused neuro assessment negative for any findings at this time. This was communicated to Barron Schmid RN night shift nurse during change of shift.

## 2019-10-31 NOTE — ED Notes (Signed)
Pt currently intubated and not able to sign consent to GV.

## 2019-10-31 NOTE — Progress Notes (Signed)
Pt arrived to [facility] ICU 205-1 via [facility]. Pt with 7.5 ETT at 22cm at the lip. Abrasion/bruising to left temple area upon arrival. RT placed pt on PRVC: 7cc/kg- 310/30/100%/+16. RT to obtain ABG. CXR pending at this time. RT will continue to monitor.

## 2019-10-31 NOTE — ED Provider Notes (Signed)
[facility] Emergency Department Provider Note  ____________________________________________   First MD Initiated Contact with Patient 10/24/2019 1300     (approximate)  I have reviewed the triage vital signs and the nursing notes.   HISTORY  Chief Complaint Respiratory Distress    HPI Courtney Jefferson is a 79 y.o. female  With h/o colon CA, recent admission for resp failure 2/2 COVID-19 on 5L [place] at discharge, here with SOB. Pt arrives in obvious respiratory distress, satting in 70s on NRB. Per report, pt was doing overall well until the last 12 hours or so, when she has developed worsening cough, SOB, and fatigue. Pt shakes her head no to any pain or discomfort on my assessment. Unable to speak 2/2 WOB.  Level 5 caveat invoked as remainder of history, ROS, and physical exam limited due to patient's acuity, respiratory distress.         Past Medical History:  Diagnosis Date  . Cancer [facility])    cancer of colon  . Hypertension   . PONV (postoperative nausea and vomiting)     Patient Active Problem List   Diagnosis Date Noted  . Acute respiratory failure with hypoxia ([place]) 10/29/2019  . Shortness of breath   . Generalized weakness   . Weakness   . Pneumonia due to COVID-19 virus 10/17/2019  . Cancer of left colon ([place]) 09/13/2019  . Goals of care, counseling/discussion 09/13/2019  . Diverticulitis large intestine w/o perforation or abscess w/o bleeding 08/25/2019  . Nausea with vomiting 08/25/2019  . Post-operative nausea and vomiting 08/25/2019  . Colonic obstruction ([place]) 08/17/2019  . Hypertension 05/03/2017  . 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 ABDOMINAL HYSTERECTOMY     partial  . COLECTOMY WITH COLOSTOMY CREATION/HARTMANN PROCEDURE  08/18/2019   Procedure: COLECTOMY WITH COLOSTOMY CREATION/HARTMANN PROCEDURE;  Surgeon: Robert Bellow, MD;  Location: [facility] ORS;  Service:  General;;  . LAPAROTOMY N/A 08/18/2019   Procedure: Exploratory Laparotomy;  Surgeon: Robert Bellow, MD;  Location: [facility] ORS;  Service: General;  Laterality: N/A;  . PORTACATH PLACEMENT N/A 09/26/2019   Procedure: INSERTION PORT-A-CATH;  Surgeon: Herbert Pun, MD;  Location: [facility] ORS;  Service: General;  Laterality: N/A;    Prior to Admission medications   Medication Sig Start Date End Date Taking? Authorizing Provider  doxycycline (VIBRA-TABS) 100 MG tablet Take 100 mg by mouth 2 (two) times daily. 10/30/19  Yes [provider]  predniSONE (DELTASONE) 20 MG tablet Take 20 mg by mouth 2 (two) times daily. 10/30/19  Yes [provider]  Grant Ruts INHUB 250-50 MCG/DOSE AEPB Inhale 1 puff into the lungs every 12 (twelve) hours. 10/30/19  Yes [provider]    Allergies Patient has no known allergies.  Family History  Problem Relation Age of Onset  . Diabetes Sister   . Cancer Brother        died young    Social History Social History   Tobacco Use  . Smoking status: Former Research scientist (life sciences)  . Smokeless tobacco: Never Used  Substance Use Topics  . Alcohol use: No  . Drug use: No    Review of Systems  Review of Systems  Unable to perform ROS: Acuity of condition  Constitutional: Positive for fever.  HENT: Positive for facial swelling.   Respiratory: Positive for cough and shortness of breath.      ____________________________________________  PHYSICAL EXAM:      VITAL SIGNS: ED  Triage Vitals  Enc Vitals Group     BP 10/29/2019 1244 (!) 152/103     Pulse Rate 10/28/2019 1244 (!) 125     Resp 10/19/2019 1244 (!) 26     Temp 10/13/2019 1244 97.7 F (36.5 C)     Temp Source 10/25/2019 1244 Axillary     SpO2 10/15/2019 1244 (!) 60 %     Weight 10/06/2019 1245 135 lb (61.2 kg)     Height 10/08/2019 1245 5' (1.524 m)     Head Circumference --      Peak Flow --      Pain Score 10/15/2019 1245 0     Pain Loc --      Pain Edu? --      Excl. in [place]? --       Physical Exam Constitutional:      General: She is in acute distress.     Appearance: She is toxic-appearing and diaphoretic.  HENT:     Mouth/Throat:     Mouth: Mucous membranes are dry.     Comments: Dry MM. Superficial bite mark to tongue with dried blood. Eyes:     Extraocular Movements: Extraocular movements intact.     Pupils: Pupils are equal, round, and reactive to light.  Cardiovascular:     Rate and Rhythm: Tachycardia present.     Pulses: Normal pulses.  Pulmonary:     Effort: Tachypnea, accessory muscle usage and respiratory distress present.     Breath sounds: Examination of the right-middle field reveals rales. Examination of the left-middle field reveals rales. Examination of the right-lower field reveals rales. Examination of the left-lower field reveals rales. Decreased breath sounds and rales present.  Abdominal:     General: Abdomen is flat.     Comments: LLQ ostomy in place, draining brown stool  Musculoskeletal:     Cervical back: Neck supple.     Right lower leg: No edema.     Left lower leg: No edema.  Skin:    Capillary Refill: Capillary refill takes less than 2 seconds.  Neurological:     General: No focal deficit present.     Comments: Drowsy, but follows commands. MAE.        ____________________________________________   LABS (all labs ordered are listed, but only abnormal results are displayed)  Labs Reviewed  CBC WITH DIFFERENTIAL/PLATELET - Abnormal; Notable for the following components:      Result Value   WBC 17.1 (*)    Neutro Abs 14.0 (*)    Monocytes Absolute 1.2 (*)    Abs Immature Granulocytes 0.25 (*)    All other components within normal limits  COMPREHENSIVE METABOLIC PANEL - Abnormal; Notable for the following components:   Glucose, Bld 148 (*)    Albumin 2.8 (*)    AST 42 (*)    ALT 54 (*)    All other components within normal limits  FIBRIN DERIVATIVES D-DIMER ([facility] ONLY) - Abnormal; Notable for the following  components:   Fibrin derivatives D-dimer ([facility]) [id number] (*)    All other components within normal limits  FIBRINOGEN - Abnormal; Notable for the following components:   Fibrinogen 578 (*)    All other components within normal limits  BLOOD GAS, ARTERIAL - Abnormal; Notable for the following components:   pO2, Arterial 63 (*)    Acid-base deficit 3.8 (*)    All other components within normal limits  CULTURE, BLOOD (ROUTINE X 2)  CULTURE, BLOOD (ROUTINE X 2)  MRSA  PCR SCREENING  TRIGLYCERIDES  LACTIC ACID, PLASMA  LACTIC ACID, PLASMA  C-REACTIVE PROTEIN    ____________________________________________  EKG: Sinus tachycardia, VR 129. QRS 67, QTc 462. Non-specific ST changes, likely rate-related. No ST elevations. ________________________________________  RADIOLOGY All imaging, including plain films, CT scans, and ultrasounds, independently reviewed by me, and interpretations confirmed via formal radiology reads.  ED MD interpretation:   CXR: Multifocal PNA CXR 2: ETT in appropriate position, worsening multifocal PNA KUB: NGT in stomach CT Angio: Multifocal PNA, no PE, ETT slightly deep - retracted 2 cm   Official radiology report(s): CT Angio Chest PE W and/or Wo Contrast  Result Date: 10/11/2019 CLINICAL DATA:  Respiratory distress, hypoxia, COVID-19 positive. History of colorectal cancer. EXAM: CT ANGIOGRAPHY CHEST WITH CONTRAST TECHNIQUE: Multidetector CT imaging of the chest was performed using the standard protocol during bolus administration of intravenous contrast. Multiplanar CT image reconstructions and MIPs were obtained to evaluate the vascular anatomy. CONTRAST:  78mL OMNIPAQUE IOHEXOL 350 MG/ML SOLN COMPARISON:  CT 10/18/2019 FINDINGS: Cardiovascular: Pulmonary vasculature is well opacified. Excessive respiratory motion degrades evaluation of the segmental and subsegmental branch pulmonary arteries. No central, main, or lobar branch filling defect is identified. Heart  size is within normal limits. No pericardial effusion. Thoracic aorta is nonaneurysmal. Left-sided chest port terminates at the superior cavoatrial junction. Mediastinum/Nodes: Mildly enlarged precarinal and subcarinal lymph nodes, likely reactive. Mild soft tissue fullness in the right hilum also likely reflecting reactive lymph nodes. No axillary lymphadenopathy. In endotracheal tube terminates just proximal to the carina. An enteric tube descends the esophagus with distal tip terminating beyond the inferior margin of the study. Thyroid gland unremarkable. Lungs/Pleura: Extensive mixed density airspace opacities throughout both lungs with basilar predominance, which have progressed from prior. Opacities within the upper lung fields are predominantly peripheral in distribution. No pleural effusion. No pneumothorax. 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 No acute findings within the visualized upper abdomen Musculoskeletal: No chest wall abnormality. No acute or significant osseous findings. Review of the MIP images confirms the above findings. IMPRESSION: 1. Respiratory motion artifact degrades evaluation of the segmental and subsegmental branch pulmonary arteries. No central, main, or lobar branch filling defect is identified to suggest pulmonary embolism. 2. Extensive mixed density airspace opacities throughout both lungs with basilar predominance, compatible with multifocal pneumonia. Findings have progressed from prior study. 3. Mildly enlarged mediastinal and hilar lymph nodes, likely reactive. 4. Endotracheal tube terminates just proximal to the carina. Recommend retraction approximately 2-3 cm. These results including the location of the ET tube were called by telephone at the time of interpretation on 10/17/2019 at 2:51 pm to provider Joni Fears, who verbally acknowledged these results. Electronically  Signed   By: Davina Poke D.O.   On: 10/30/2019 14:52   DG Chest Port 1 View  Result Date: 10/16/2019 CLINICAL DATA:  Endotracheal and OG tube placement. EXAM: PORTABLE CHEST 1 VIEW COMPARISON:  10/17/2019 FINDINGS: 1343 hours. Endotracheal tube tip is 2.2 cm above the base of the carina. The NG tube passes into the stomach although the distal tip position is not included on the film. Left Port-A-Cath tip is positioned over the distal SVC. Lower lung volumes with slight progression of patchy airspace opacity in the right mid and lower lung as well as left base. No evidence for pneumothorax or pleural effusion. The visualized bony structures of the thorax are intact. Telemetry leads overlie the chest. IMPRESSION: 1.  Endotracheal and NG tubes as described. 2. Interval progression of patchy bilateral airspace disease, right greater than left compatible with multifocal pneumonia. Electronically Signed   By: Misty Stanley M.D.   On: 10/16/2019 14:13   DG Abd Portable 1 View  Result Date: 11/03/2019 CLINICAL DATA:  Tube placement EXAM: PORTABLE ABDOMEN - 1 VIEW COMPARISON:  08/27/2019 FINDINGS: Enteric tube terminates in the distal stomach in the region of the antrum. Partially imaged central line. Bibasilar opacities. Dense calcification overlies the left upper quadrant. Bowel gas pattern is unremarkable. IMPRESSION: Enteric tube terminates in the distal stomach. Electronically Signed   By: Macy Mis M.D.   On: 10/27/2019 14:16    ____________________________________________  PROCEDURES   Procedure(s) performed (including Critical Care):  .Critical Care Performed by: Duffy Bruce, MD Authorized by: Duffy Bruce, MD   Critical care provider statement:    Critical care time (minutes):  75   Critical care time was exclusive of:  Separately billable procedures and treating other patients and teaching time   Critical care was necessary to treat or prevent imminent or life-threatening  deterioration of the following conditions:  Cardiac failure, circulatory failure, respiratory failure and sepsis   Critical care was time spent personally by me on the following activities:  Development of treatment plan with patient or surrogate, discussions with consultants, evaluation of patient's response to treatment, examination of patient, obtaining history from patient or surrogate, ordering and performing treatments and interventions, ordering and review of laboratory studies, ordering and review of radiographic studies, pulse oximetry, re-evaluation of patient's condition and review of old charts   I assumed direction of critical care for this patient from another provider in my specialty: no   Procedure Name: Intubation Date/Time: 10/10/2019 3:38 PM Performed by: Duffy Bruce, MD Pre-anesthesia Checklist: Patient identified, Patient being monitored, Emergency Drugs available, Timeout performed and Suction available Oxygen Delivery Method: Non-rebreather mask Preoxygenation: Pre-oxygenation with 100% oxygen Induction Type: Rapid sequence Ventilation: Mask ventilation without difficulty Laryngoscope Size: Glidescope and 4 Grade View: Grade I Tube size: 7.5 mm Number of attempts: 1 Airway Equipment and Method: Video-laryngoscopy and Rigid stylet Placement Confirmation: ETT inserted through vocal cords under direct vision,  CO2 detector and Breath sounds checked- equal and bilateral Secured at: 24 cm Tube secured with: Tape Dental Injury: Teeth and Oropharynx as per pre-operative assessment  Difficulty Due To: Difficulty was unanticipated Future Recommendations: Recommend- induction with short-acting agent, and alternative techniques readily available       ____________________________________________  INITIAL IMPRESSION / MDM / ASSESSMENT AND PLAN / ED COURSE  As part of my medical decision making, I reviewed the following data within the [place]  notes reviewed and incorporated, Old chart reviewed, Notes from prior ED visits, and [place] Controlled Substance Database       *Merle Hauck Kurtzman was evaluated in Emergency Department on 10/07/2019 for the symptoms described in the history of present illness. She was evaluated in the context of the global COVID-19 pandemic, which necessitated consideration that the patient might be at risk for infection with the SARS-CoV-2 virus that causes COVID-19. Institutional protocols and algorithms that pertain to the evaluation of patients at risk for COVID-19 are in a state of rapid change based on information released by regulatory bodies including the CDC and federal and state organizations. These policies and algorithms were followed during the patient's care in the ED.  Some ED evaluations and interventions may be delayed as a result of limited staffing during the  pandemic.*     Medical Decision Making:  79 yo F here with profound hypoxic respiratory failure 2/2 COVID-19, with possible superimposed bacterial PNA. On arrival, pt in severe resp distress with sats in the 70s on NRB and 10L [place]. Pt was briefly placed on Optiflow and NRB for pre-oxygenation then intubated as above. Sats in the mid 80s through intubation with brief improvement to 88-89 after intubation. CXR shows multifocal PNA. Given her profound hypoxia, CT Angio obtained and shows multifocal PNA w/o signs of PE. ETT drawn back after CT.   Called and discussed with Dr. Ruthann Cancer, at [facility], who has accepted. Discussed with daughter via telephone as wel, who is aware of pt's grave prognosis but confirms full code status.  ABG obtained on 100%FiO2 with 12 PEEP shows pO2 63, c/w ARDS like picture. Pt sedation optimized and will trial vec for vent dyssynchrony. Otherwise, only consideration would be prone positioning.   Signed out to Dr. Cherylann Banas. Pt critically ill with poor prognosis,  unfortunately.  ____________________________________________  FINAL CLINICAL IMPRESSION(S) / ED DIAGNOSES  Final diagnoses:  Acute hypoxemic respiratory failure due to COVID-19 [facility])     MEDICATIONS GIVEN DURING THIS VISIT:  Medications  propofol (DIPRIVAN) 1000 MG/100ML infusion (30 mcg/kg/min  55.7 kg Intravenous Rate/Dose Verify 10/23/2019 1542)  fentaNYL (SUBLIMAZE) injection 50 mcg (has no administration in time range)  fentaNYL (SUBLIMAZE) injection 50 mcg (50 mcg Intravenous Bolus 10/27/2019 1409)  vancomycin (VANCOREADY) IVPB 1250 mg/250 mL (1,250 mg Intravenous New Bag/Given 10/12/2019 1448)  fentaNYL 2537mcg in NS 263mL (29mcg/ml) infusion-PREMIX (50 mcg/hr Intravenous Rate/Dose Verify 11/03/2019 1542)  fentaNYL (SUBLIMAZE) bolus via infusion 25 mcg (has no administration in time range)  dexamethasone (DECADRON) injection 10 mg (10 mg Intravenous Given 10/05/2019 1410)  etomidate (AMIDATE) injection 15 mg (15 mg Intravenous Given 10/25/2019 1340)  succinylcholine (ANECTINE) injection 90 mg (90 mg Intravenous Given 10/19/2019 1340)  iohexol (OMNIPAQUE) 350 MG/ML injection 75 mL (75 mLs Intravenous Contrast Given 10/18/2019 1428)  ceFEPIme (MAXIPIME) 2 g in sodium chloride 0.9 % 100 mL IVPB (0 g Intravenous Stopped 10/19/2019 1441)  lactated ringers bolus 500 mL (500 mLs Intravenous New Bag/Given 10/14/2019 1506)  fentaNYL (SUBLIMAZE) injection 50 mcg (50 mcg Intravenous Given 10/16/2019 1509)  ipratropium-albuterol (DUONEB) 0.5-2.5 (3) MG/3ML nebulizer solution 3 mL (3 mLs Nebulization Given 11/02/2019 1521)  vecuronium (NORCURON) injection 5.6 mg ( Intravenous See Procedure Record 10/28/2019 1532)     ED Discharge Orders    None       Note:  This document was prepared using Dragon voice recognition software and may include unintentional dictation errors.   Duffy Bruce, MD 10/10/2019 [phone]

## 2019-10-31 NOTE — Progress Notes (Signed)
  Patient placed in prone position.  ETT secured with cloth tape at  22 cm at the lip.  Patient tolerated well with no complications.

## 2019-10-31 NOTE — Progress Notes (Signed)
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 She is intubated and on the ventilator.  eICU Interventions  New patient evaluation completed.        Kerry Kass Malichi Palardy 10/15/2019, 7:54 PM

## 2019-10-31 NOTE — ED Notes (Signed)
Pt continuing to struggle to breathe on high flow [place] with sats in the 70's. EDP at bedside.

## 2019-10-31 NOTE — ED Notes (Signed)
[facility]  CALLED  FOR  TRANSFER PER  DR  Ellender Hose MD

## 2019-10-31 NOTE — Progress Notes (Signed)
Pharmacy Antibiotic Note  Banelly Rosenburg is a 79 y.o. female with hx colon cancer recently admitted 1/13-1/18 with COVID-19, now admitted on 10/29/2019 with worsening hypoxemia requiring intubation.  Pharmacy has been consulted for vancomycin dosing.  Plan: Cefepime 2g IV q12h Vancomycin 1250mg  IV given at [facility], continue with 750mg  IV q24h for estimated AUC 480 (goal 400-550) Follow up renal function & cultures    Temp (24hrs), Avg:98.8 F (37.1 C), Min:97.7 F (36.5 C), Max:99.1 F (37.3 C)  Recent Labs  Lab 10/12/2019 1251 10/20/2019 1346  WBC 17.1*  --   CREATININE 0.71  --   LATICACIDVEN  --  1.8    Estimated Creatinine Clearance: 45.4 mL/min (by C-G formula based on SCr of 0.71 mg/dL).    No Known Allergies  Antimicrobials this admission: 1/14 Remdesivir >> 1/18; repeat 1/27 >> 1/31 1/27 Vancomycin >> 1/27 Cefepime >>  Dose adjustments this admission:  Microbiology results: 1/27 BCx:  Thank you for allowing pharmacy to be a part of this patient's care.  Peggyann Juba, PharmD, BCPS Pharmacy: [phone] 10/06/2019 7:19 PM

## 2019-10-31 NOTE — ED Provider Notes (Signed)
-----------------------------------------   5:27 PM on 10/15/2019 -----------------------------------------  I took over care of this patient from Dr. Ellender Hose.  At the time of signout, the patient was intermittently coughing and breathing against the ventilator despite adequate sedation and had an O2 saturation in the 80s and borderline blood pressure.  Dr. Ellender Hose ordered vecuronium with marked improvement in the patient's oxygenation and vital signs.  She has remained in the mid to high 90s for the last several hours with a much improved blood pressure.  [facility] is here to transport the patient to [facility].  Patient now had a slight drop in her oxygenation and is starting to move somewhat; the vecuronium appears to be wearing off so I will redose it prior to transfer.  The patient continues to be adequately sedated with propofol.  At this time, the patient is stable for transfer to [facility].   Arta Silence, MD 10/14/2019 1728

## 2019-10-31 NOTE — ED Triage Notes (Signed)
Pt arrived via [facility] from home with respiratory distress. Pt was positive covid, does not remember when. Oxygen sats low at home.

## 2019-10-31 NOTE — Consult Note (Signed)
PHARMACY -  BRIEF ANTIBIOTIC NOTE   Pharmacy has received consult(s) for [facility]CAP from an ED provider.  The patient's profile has been reviewed for ht/wt/allergies/indication/available labs.    One time order(s) placed for vancomycin and cefepime.   Further antibiotics/pharmacy consults should be ordered by admitting physician if indicated.                       Thank you, Courtney Jefferson 10/24/2019  2:09 PM

## 2019-10-31 NOTE — ED Notes (Signed)
ET tube in place by EDP.

## 2019-10-31 NOTE — H&P (Signed)
NAME:  Courtney Jefferson, MRN:  [id number], DOB:  1941-06-23, LOS: 0 ADMISSION DATE:  (Not on file), CONSULTATION DATE:  10/20/2019 REFERRING MD:  osh, CHIEF COMPLAINT:  Acute hypoxemic resp failure  Brief History   79 yo with new dx of covid on 1/7, recent hospitalization 1/13-1/18 with covid, represented with worsening hypoxemia req intubation.   History of present illness   79 yo with recent dx of covid on 1/7 was treated at home until she required hospitalization on 1/13. She completed 5 days remdesivir and 5 days steroids and was discharged with 5 days of steroids as well as on 5L [place]. She represented today after being found hypoxic to 62 with sob. She was placed on NRB with stalled improvement to 70s after breathing therapies she was able to improve to 80's but subsequently req intubation. She was given abx at time of presentation there 2/2 worsening infiltrates and wbc. She was also resumed on decadron.   Past Medical History   Past Medical History:  Diagnosis Date  . Cancer [facility])    cancer of colon  . Hypertension   . PONV (postoperative nausea and vomiting)     Significant Hospital Events   Intubated 1/27  Consults:    Procedures:   None  Significant Diagnostic Tests:  1/27: ctpa: 1. Respiratory motion artifact degrades evaluation of the segmental and subsegmental branch pulmonary arteries. No central, main, or lobar branch filling defect is identified to suggest pulmonary embolism. 2. Extensive mixed density airspace opacities throughout both lungs with basilar predominance, compatible with multifocal pneumonia. Findings have progressed from prior study. 3. Mildly enlarged mediastinal and hilar lymph nodes, likely reactive. 4. Endotracheal tube terminates just proximal to the carina. Recommend retraction approximately 2-3 cm.   Micro Data:  1/7: sars2: psotive  Antimicrobials:  Vancomycin 1/27>> Cefepime 1/27>> Remdesivir 1/27>> Interim history/subjective:   Comfortable, arousable  Objective   There were no vitals taken for this visit.    Vent Mode: PRVC FiO2 (%):  [100 %] 100 % Set Rate:  [24 bmp] 24 bmp Vt Set:  [450 mL] 450 mL PEEP:  [10 [id number] cmH20] 12 cmH20  No intake or output data in the 24 hours ending 11/01/2019 1805 There were no vitals filed for this visit.  Examination: General: no acute distress HEENT: NCAT, EOMI, PERRLA, MMMP Lungs: CTA, no wheezes, rhonchi, rales Cardiovascular: RRR, no m/g/r Abdomen: soft, NT,ND, BS+ Extremities: + edema Skin: no rashes, warm and dry Neuro: moves all 4 extremities GU:   Resolved Hospital Problem list     Assessment & Plan:  Acute hypoxemic resp failure with covid 19:  -dx'd 1/7 (recent hospitalization 1/13-1/18) -completed remdesivir during original hospitalization -cont decadron here (although did comeplete 10 days of therapy) -Continue mechanical ventilation per ARDS protocol -Target TVol 6-8cc/kgIBW -Target Plateau Pressure < 30cm H20 -Target driving pressure less than 15 cm of water -Target PaO2 55-65: titrate PEEP/FiO2 per protocol -await abg for proning potential  -Target CVP less than 4, diurese as necessary -Ventilator associated pneumonia prevention protocol -will send strep legionella for completeness sake with worsening despite therapy - was given dose of abx at osh  -pct 0.61 and wbc up to 17 will get sputum cx and urine (blood already sent).  -cover empirically ? Superimposed bacterial pneumonia.  -recheck markers -ctpa negative for pe Sepsis 2/2 above:  -as above.  -lactate normal  Transaminitis:  -actually improving  Best practice:  Diet: npo Pain/Anxiety/Delirium protocol (if indicated): per protocol VAP protocol (  if indicated): per protocol DVT prophylaxis: heparin GI prophylaxis: famotidine Glucose control: monitor Mobility: bed  Code Status: FULL Family Communication:  Disposition: ICU  Labs   CBC: Recent Labs  Lab 10/06/2019 1251   WBC 17.1*  NEUTROABS 14.0*  HGB 14.3  HCT 44.7  MCV 88.7  PLT [id number]    Basic Metabolic Panel: Recent Labs  Lab 10/23/2019 1251  NA 137  K 3.9  CL 102  CO2 25  GLUCOSE 148*  BUN 19  CREATININE 0.71  CALCIUM 9.1   GFR: Estimated Creatinine Clearance: 45.4 mL/min (by C-G formula based on SCr of 0.71 mg/dL). Recent Labs  Lab 10/30/2019 1251 11/01/2019 1346  WBC 17.1*  --   LATICACIDVEN  --  1.8    Liver Function Tests: Recent Labs  Lab 10/18/2019 1251  AST 42*  ALT 54*  ALKPHOS 64  BILITOT 1.0  PROT 7.6  ALBUMIN 2.8*   No results for input(s): LIPASE, AMYLASE in the last 168 hours. No results for input(s): AMMONIA in the last 168 hours.  ABG    Component Value Date/Time   PHART 7.35 10/21/2019 1445   PCO2ART 39 10/19/2019 1445   PO2ART 63 (L) 10/13/2019 1445   HCO3 21.5 11/02/2019 1445   ACIDBASEDEF 3.8 (H) 11/03/2019 1445   O2SAT 90.5 10/30/2019 1445     Coagulation Profile: No results for input(s): INR, PROTIME in the last 168 hours.  Cardiac Enzymes: No results for input(s): CKTOTAL, CKMB, CKMBINDEX, TROPONINI in the last 168 hours.  HbA1C: No results found for: HGBA1C  CBG: No results for input(s): GLUCAP in the last 168 hours.  Review of Systems:   unobtainable  Past Medical History  She,  has a past medical history of Cancer ([place]), Hypertension, and PONV (postoperative nausea and vomiting).   Surgical History    Past Surgical History:  Procedure Laterality Date  . ABDOMINAL HYSTERECTOMY     partial  . COLECTOMY WITH COLOSTOMY CREATION/HARTMANN PROCEDURE  08/18/2019   Procedure: COLECTOMY WITH COLOSTOMY CREATION/HARTMANN PROCEDURE;  Surgeon: Robert Bellow, MD;  Location: [facility] ORS;  Service: General;;  . LAPAROTOMY N/A 08/18/2019   Procedure: Exploratory Laparotomy;  Surgeon: Robert Bellow, MD;  Location: [facility] ORS;  Service: General;  Laterality: N/A;  . PORTACATH PLACEMENT N/A 09/26/2019   Procedure: INSERTION PORT-A-CATH;   Surgeon: Herbert Pun, MD;  Location: [facility] ORS;  Service: General;  Laterality: N/A;     Social History   reports that she has quit smoking. She has never used smokeless tobacco. She reports that she does not drink alcohol or use drugs.   Family History   Her family history includes Cancer in her brother; Diabetes in her sister.   Allergies No Known Allergies   Home Medications  Prior to Admission medications   Medication Sig Start Date End Date Taking? Authorizing Provider  doxycycline (VIBRA-TABS) 100 MG tablet Take 100 mg by mouth 2 (two) times daily. 10/30/19   [provider]  predniSONE (DELTASONE) 20 MG tablet Take 20 mg by mouth 2 (two) times daily. 10/30/19   [provider]  Grant Ruts INHUB 250-50 MCG/DOSE AEPB Inhale 1 puff into the lungs every 12 (twelve) hours. 10/30/19   [provider]     Critical care time: The patient is critically ill with multiple organ systems failure and requires high complexity decision making for assessment and support, frequent evaluation and titration of therapies, application of advanced monitoring technologies and extensive interpretation of multiple databases.  Critical care time  35 mins. This represents my time independent of the NP's/PA's/med students/residents time taking care of the pt. This is excluding procedures.     [facility] 10/22/2019, 6:05 PM

## 2019-11-01 ENCOUNTER — Inpatient Hospital Stay (HOSPITAL_COMMUNITY): Payer: Medicare Other

## 2019-11-01 ENCOUNTER — Inpatient Hospital Stay: Payer: Medicare Other

## 2019-11-01 DIAGNOSIS — I319 Disease of pericardium, unspecified: Secondary | ICD-10-CM

## 2019-11-01 LAB — CBC WITH DIFFERENTIAL/PLATELET
Abs Immature Granulocytes: 0.24 10*3/uL — ABNORMAL HIGH (ref 0.00–0.07)
Basophils Absolute: 0 10*3/uL (ref 0.0–0.1)
Basophils Relative: 0 %
Eosinophils Absolute: 0 10*3/uL (ref 0.0–0.5)
Eosinophils Relative: 0 %
HCT: 37.9 % (ref 36.0–46.0)
Hemoglobin: 12 g/dL (ref 12.0–15.0)
Immature Granulocytes: 1 %
Lymphocytes Relative: 7 %
Lymphs Abs: 1.3 10*3/uL (ref 0.7–4.0)
MCH: 29.1 pg (ref 26.0–34.0)
MCHC: 31.7 g/dL (ref 30.0–36.0)
MCV: 92 fL (ref 80.0–100.0)
Monocytes Absolute: 1.3 10*3/uL — ABNORMAL HIGH (ref 0.1–1.0)
Monocytes Relative: 7 %
Neutro Abs: 15.5 10*3/uL — ABNORMAL HIGH (ref 1.7–7.7)
Neutrophils Relative %: 85 %
Platelets: 239 10*3/uL (ref 150–400)
RBC: 4.12 MIL/uL (ref 3.87–5.11)
RDW: 15.2 % (ref 11.5–15.5)
WBC: 18.4 10*3/uL — ABNORMAL HIGH (ref 4.0–10.5)
nRBC: 0 % (ref 0.0–0.2)

## 2019-11-01 LAB — COMPREHENSIVE METABOLIC PANEL
ALT: 43 U/L (ref 0–44)
AST: 36 U/L (ref 15–41)
Albumin: 2.5 g/dL — ABNORMAL LOW (ref 3.5–5.0)
Alkaline Phosphatase: 68 U/L (ref 38–126)
Anion gap: 8 (ref 5–15)
BUN: 17 mg/dL (ref 8–23)
CO2: 22 mmol/L (ref 22–32)
Calcium: 8.7 mg/dL — ABNORMAL LOW (ref 8.9–10.3)
Chloride: 110 mmol/L (ref 98–111)
Creatinine, Ser: 0.56 mg/dL (ref 0.44–1.00)
GFR calc Af Amer: >60 mL/min (ref >60)
GFR calc non Af Amer: >60 mL/min (ref >60)
Glucose, Bld: 90 mg/dL (ref 70–99)
Potassium: 5.1 mmol/L (ref 3.5–5.1)
Sodium: 140 mmol/L (ref 135–145)
Total Bilirubin: 0.7 mg/dL (ref 0.3–1.2)
Total Protein: 6.3 g/dL — ABNORMAL LOW (ref 6.5–8.1)

## 2019-11-01 LAB — POCT I-STAT 7, (LYTES, BLD GAS, ICA,H+H)
Acid-base deficit: 4 mmol/L — ABNORMAL HIGH (ref 0.0–2.0)
Acid-base deficit: 3 mmol/L — ABNORMAL HIGH (ref 0.0–2.0)
Bicarbonate: 22.1 mmol/L (ref 20.0–28.0)
Bicarbonate: 23.2 mmol/L (ref 20.0–28.0)
Calcium, Ion: 1.24 mmol/L (ref 1.15–1.40)
Calcium, Ion: 1.27 mmol/L (ref 1.15–1.40)
HCT: 34 % — ABNORMAL LOW (ref 36.0–46.0)
HCT: 35 % — ABNORMAL LOW (ref 36.0–46.0)
Hemoglobin: 11.6 g/dL — ABNORMAL LOW (ref 12.0–15.0)
Hemoglobin: 11.9 g/dL — ABNORMAL LOW (ref 12.0–15.0)
O2 Saturation: 92 %
O2 Saturation: 84 %
Patient temperature: 37.4
Potassium: 4.5 mmol/L (ref 3.5–5.1)
Potassium: 4.5 mmol/L (ref 3.5–5.1)
Sodium: 137 mmol/L (ref 135–145)
Sodium: 138 mmol/L (ref 135–145)
TCO2: 23 mmol/L (ref 22–32)
TCO2: 25 mmol/L (ref 22–32)
pCO2 arterial: 44.1 mmHg (ref 32.0–48.0)
pCO2 arterial: 45.9 mmHg (ref 32.0–48.0)
pH, Arterial: 7.308 — ABNORMAL LOW (ref 7.350–7.450)
pH, Arterial: 7.314 — ABNORMAL LOW (ref 7.350–7.450)
pO2, Arterial: 69 mmHg — ABNORMAL LOW (ref 83.0–108.0)
pO2, Arterial: 54 mmHg — ABNORMAL LOW (ref 83.0–108.0)

## 2019-11-01 LAB — GLUCOSE, CAPILLARY
Glucose-Capillary: 73 mg/dL (ref 70–99)
Glucose-Capillary: 140 mg/dL — ABNORMAL HIGH (ref 70–99)
Glucose-Capillary: 180 mg/dL — ABNORMAL HIGH (ref 70–99)

## 2019-11-01 LAB — C-REACTIVE PROTEIN: CRP: 4.5 mg/dL — ABNORMAL HIGH (ref <1.0)

## 2019-11-01 LAB — STREP PNEUMONIAE URINARY ANTIGEN: Strep Pneumo Urinary Antigen: NEGATIVE

## 2019-11-01 LAB — FERRITIN: Ferritin: 283 ng/mL (ref 11–307)

## 2019-11-01 LAB — TRIGLYCERIDES: Triglycerides: 150 mg/dL — ABNORMAL HIGH (ref <150)

## 2019-11-01 LAB — ABO/RH: ABO/RH(D): O POS

## 2019-11-01 LAB — LACTIC ACID, PLASMA
Lactic Acid, Venous: 1.4 mmol/L (ref 0.5–1.9)
Lactic Acid, Venous: 1.4 mmol/L (ref 0.5–1.9)

## 2019-11-01 LAB — D-DIMER, QUANTITATIVE: D-Dimer, Quant: 8.8 ug/mL-FEU — ABNORMAL HIGH (ref 0.00–0.50)

## 2019-11-01 LAB — MRSA PCR SCREENING: MRSA by PCR: NEGATIVE

## 2019-11-01 LAB — PHOSPHORUS: Phosphorus: 3.9 mg/dL (ref 2.5–4.6)

## 2019-11-01 LAB — MAGNESIUM: Magnesium: 2.1 mg/dL (ref 1.7–2.4)

## 2019-11-01 MED ORDER — ORAL CARE MOUTH RINSE
15.0000 mL | OROMUCOSAL | Status: DC
  Administered 2019-11-01 – 2019-11-03 (×25): 15 mL via OROMUCOSAL

## 2019-11-01 MED ORDER — FREE WATER
200.0000 mL | Freq: Four times a day (QID) | Status: DC
  Administered 2019-11-01 – 2019-11-02 (×4): 200 mL

## 2019-11-01 MED ORDER — CHLORHEXIDINE GLUCONATE 0.12% ORAL RINSE (MEDLINE KIT)
15.0000 mL | Freq: Two times a day (BID) | OROMUCOSAL | Status: DC
  Administered 2019-11-01 – 2019-11-03 (×5): 15 mL via OROMUCOSAL

## 2019-11-01 MED ORDER — ENOXAPARIN SODIUM 40 MG/0.4ML ~~LOC~~ SOLN
40.0000 mg | Freq: Two times a day (BID) | SUBCUTANEOUS | Status: DC
  Administered 2019-11-01 – 2019-11-03 (×5): 40 mg via SUBCUTANEOUS
  Filled 2019-11-01 (×4): qty 0.4

## 2019-11-01 MED ORDER — VITAL HIGH PROTEIN PO LIQD
1000.0000 mL | ORAL | Status: DC
  Administered 2019-11-01 – 2019-11-02 (×2): 1000 mL

## 2019-11-01 MED ORDER — CHLORHEXIDINE GLUCONATE CLOTH 2 % EX PADS
6.0000 | MEDICATED_PAD | Freq: Every day | CUTANEOUS | Status: DC
  Administered 2019-11-01 – 2019-11-03 (×3): 6 via TOPICAL

## 2019-11-01 NOTE — Progress Notes (Signed)
Pt turned supine at this time with no complications. VS within normal limits. No breakdown noted. Pt ett remains securely taped in proper position

## 2019-11-01 NOTE — Progress Notes (Signed)
Facetime done with patient's daughters as requested so that they could speak to and see their mother.

## 2019-11-01 NOTE — Progress Notes (Signed)
Pt's  Head turned and arms rotated with no complications.  ETT secured with cloth tape.

## 2019-11-01 NOTE — Progress Notes (Signed)
 Initial Nutrition Assessment  DOCUMENTATION CODES:   Not applicable  INTERVENTION:   Recommend initaition of TF within 24 hours if unable to extubate  Tube Feeding:  Vital High Protein at 40 ml/hr Provides 85 g of protein, 960 kcals and 806 mL of free water  TF regimen and propofol at current rate providing 1314 total kcal/day   Add Free Water 200 mL q 6 hours: total free water 1606 mL  Add MVI with Minerals   NUTRITION DIAGNOSIS:   Inadequate oral intake related to acute illness, catabolic illness as evidenced by NPO status.  GOAL:   Patient will meet greater than or equal to 90% of their needs  MONITOR:   Vent status, Labs, Weight trends, TF tolerance  REASON FOR ASSESSMENT:   Ventilator    ASSESSMENT:   79 yo female with dx of COVID-19 on 1/07 and recent hospitalization from 1/13 to 1/18 and now readmitted with worsening respiratory failure requiring intubation. PMH includes HTN   RD working remotely.  1/27 Admit, Intubated  Patient is currently intubated on ventilator support MV: 8.5 L/min Temp (24hrs), Avg:98.9 F (37.2 C), Min:97.8 F (36.6 C), Max:99.7 F (37.6 C)  Propofol: 13.4 ml/hr  Unable to obtain diet and weight history at this time  Current wt 55.7 kg; noted weight of 61.2 kg 1/05. 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  Estimated Nutritional Needs:   Kcal:  1256 kcals (PSU),  1000-1115 kcals (kcals/kg)  Protein:  75-100 g  Fluid:  >/= 1.5 L    Luismario Coston MS, RDN, LDN, CNSC [phone] Pager  [phone] Weekend/On-Call Pager

## 2019-11-01 NOTE — Progress Notes (Signed)
Pt's head turned and arms rotated with no complications.  ETT secured, no breakdown noted.

## 2019-11-01 NOTE — Progress Notes (Signed)
eLink Physician-Brief Progress Note Patient Name: Courtney Jefferson DOB: [birth year] MRN: [id number]   Date of Service  11/01/2019  HPI/Events of Note  Pt critically ill with need for accurate monitoring of I/O  eICU Interventions  Order to continue Foley entered.        Kerry Kass Vila Dory 11/01/2019, 2:13 AM

## 2019-11-01 NOTE — Plan of Care (Signed)
  Problem: Nutrition Goal: Patient maintains adequate hydration Outcome: Progressing Goal: Patient maintains weight Outcome: Progressing   Problem: Education: Goal: Knowledge of risk factors and measures for prevention of condition will improve Outcome: Progressing   Problem: Coping: Goal: Psychosocial and spiritual needs will be supported Outcome: Progressing   Problem: Respiratory: Goal: Will maintain a patent airway Outcome: Progressing   Problem: Respiratory: Goal: Complications related to the disease process, condition or treatment will be avoided or minimized Outcome: Not Progressing Note: Patient with new pneumomediastinum today. Family made decision to refuse chest tube (see MD note).

## 2019-11-01 NOTE — Progress Notes (Signed)
Sputum sample sent to lab per MD order 

## 2019-11-01 NOTE — Progress Notes (Addendum)
Updated daughter with repeat cxr findings. Does have definitive but small ptx noted on cxr R lateral. Along with certain worsening of subcut and mediastinal air.   We have minimized pressures and peep on vent to best of our ability. Will attmpt to increase fio2 further if needed to drop peep more. Which may be out of ards range but necessary as family is considering not during chest tube and changing to dnr.   They are hopeful for recovery but do not want their mother put "thru anything else". They want her to be "comfortable" but also get better. They understand without the chest tube she could worsen but they also accept that with the chest tube she could still worsen and not survive.   They state that since before she was admitted the first time to the hospital for covid she was weak and ever since then she has not been herself. She was discharged from the hospital on 1./18 and they report she was so weak she could not walk and needed 5L of oxygen. They endorse she was not eating well or doing anything at home but getting worse prompting her return to the hospital for this admission. They do not want her " to suffer".   Will reach back out shortly to see the conclusion they have arrived at.   UPDATE  Family would like cont with current treatment, no chest tube, no further invasive procedures. But ok with pressors. Will change code status to DNR.   They would like to video in with pt and [facility] notified RN of this.

## 2019-11-01 NOTE — Progress Notes (Signed)
Family update given via telephone to daughter, Loletha Carrow.

## 2019-11-01 NOTE — Progress Notes (Signed)
NAME:  Courtney Jefferson, MRN:  [id number], DOB:  May 27, 1941, LOS: 1 ADMISSION DATE:  10/25/2019, CONSULTATION DATE:  10/23/2019 REFERRING MD:  osh, CHIEF COMPLAINT:  Acute hypoxemic resp failure  Brief History   79 yo with new dx of covid on 1/7, recent hospitalization 1/13-1/18 with covid, represented with worsening hypoxemia req intubation.   History of present illness   79 yo with recent dx of covid on 1/7 was treated at home until she required hospitalization on 1/13. She completed 5 days remdesivir and 5 days steroids and was discharged with 5 days of steroids as well as on 5L [place]. She represented today after being found hypoxic to 62 with sob. She was placed on NRB with stalled improvement to 70s after breathing therapies she was able to improve to 80's but subsequently req intubation. She was given abx at time of presentation there 2/2 worsening infiltrates and wbc. She was also resumed on decadron.   Past Medical History   Past Medical History:  Diagnosis Date  . Cancer [facility])    cancer of colon  . Hypertension   . PONV (postoperative nausea and vomiting)     Significant Hospital Events   Intubated 1/27  Consults:    Procedures:   None  Significant Diagnostic Tests:  1/27: ctpa: 1. Respiratory motion artifact degrades evaluation of the segmental and subsegmental branch pulmonary arteries. No central, main, or lobar branch filling defect is identified to suggest pulmonary embolism. 2. Extensive mixed density airspace opacities throughout both lungs with basilar predominance, compatible with multifocal pneumonia. Findings have progressed from prior study. 3. Mildly enlarged mediastinal and hilar lymph nodes, likely reactive. 4. Endotracheal tube terminates just proximal to the carina. Recommend retraction approximately 2-3 cm.   Micro Data:  1/7: sars2: postive 1/27 blood: NGTD 1/28 resp:  1/28 urine:  Antimicrobials:  Vancomycin 1/27>> Cefepime  1/27>> Remdesivir completed 1/13-1/18 Interim history/subjective:  1/28: admitted yesterday evening. proning protocol pursued. Soft bp this am with systolics in [id number]. Lactate rising at last check around 2200 will recheck. New pneumomediastinum on imaging. Will recheck later today.   Objective   Blood pressure (!) 88/63, pulse 94, temperature 99 F (37.2 C), resp. rate 19, SpO2 93 %.    Vent Mode: PRVC FiO2 (%):  [90 %-100 %] 90 % Set Rate:  [24 bmp-34 bmp] 34 bmp Vt Set:  [310 mL-450 mL] 310 mL PEEP:  [10 cmH20-16 cmH20] 16 cmH20 Plateau Pressure:  [28 cmH20-32 cmH20] 30 cmH20   Intake/Output Summary (Last 24 hours) at 11/01/2019 0800 Last data filed at 11/01/2019 0600 Gross per 24 hour  Intake 359.61 ml  Output 575 ml  Net -215.39 ml   There were no vitals filed for this visit.  Examination: General: no acute distress HEENT: NCAT, EOMI, PERRLA, MMMP Lungs: CTA, no wheezes, rhonchi, rales Cardiovascular: RRR, no m/g/r Abdomen: soft, NT,ND, BS+ Extremities: + edema Skin: no rashes, warm and dry Neuro: moves all 4 extremities GU:   Resolved Hospital Problem list     Assessment & Plan:  Acute hypoxemic resp failure with covid 19:  -dx'd 1/7 (recent hospitalization 1/13-1/18) -completed remdesivir during original hospitalization -cont decadron here (although did comeplete 10 days of therapy) -Continue mechanical ventilation per ARDS protocol -Target TVol 6-8cc/kgIBW -Target Plateau Pressure < 30cm H20 -Target driving pressure less than 15 cm of water -Target PaO2 55-65: titrate PEEP/FiO2 per protocol -await abg for proning potential  -Target CVP less than 4, diurese as necessary -Ventilator associated pneumonia prevention  protocol -f/u strep (negative) legionella for completeness sake with worsening despite therapy - was given dose of abx at osh  -pct 0.61 but down here to <0.1 and wbc up to 23 but decreasing to 18 -will get sputum cx and urine (blood already sent).   -cover empirically ? Superimposed bacterial pneumonia.  -ddimer 8.69->8.8, escalated lovenox dosing -ctpa negative for pe -cxr with slightly improved aeration but new pneumomediastinum. No ptx appreciated. Will follow with recheck this afternoon and minimize peep, personally reviewed by me.  Sepsis 2/2 above:  -as above.  Lactic acidosis:  -rising as of last check aroudn 2200... will recheck  Transaminitis:  -actually improving  Best practice:  Diet: npo Pain/Anxiety/Delirium protocol (if indicated): per protocol VAP protocol (if indicated): per protocol DVT prophylaxis: heparin GI prophylaxis: famotidine Glucose control: monitor Mobility: bed  Code Status: FULL Family Communication: updated daughter via phone 1/28.Marland Kitchen will call again once 1200 cxr back and plan made.  Disposition: ICU  Labs   CBC: Recent Labs  Lab 10/11/2019 1251 10/10/2019 1924 10/19/2019 2111 11/01/19 0531 11/01/19 0645  WBC 17.1* 23.2*  --   --  18.4*  NEUTROABS 14.0* 21.7*  --   --  15.5*  HGB 14.3 13.1 11.2* 11.6* 12.0  HCT 44.7 41.0 33.0* 34.0* 37.9  MCV 88.7 91.7  --   --  92.0  PLT 326 223  --   --  [id number]    Basic Metabolic Panel: Recent Labs  Lab 10/13/2019 1251 11/03/2019 1924 10/08/2019 2111 11/01/19 0531  NA 137 138 138 137  K 3.9 4.7 4.6 4.5  CL 102 105  --   --   CO2 25 22  --   --   GLUCOSE 148* 154*  --   --   BUN 19 16  --   --   CREATININE 0.71 0.56  --   --   CALCIUM 9.1 8.3*  --   --    GFR: Estimated Creatinine Clearance: 45.4 mL/min (by C-G formula based on SCr of 0.56 mg/dL). Recent Labs  Lab 10/25/2019 1251 10/27/2019 1346 10/06/2019 1924 10/14/2019 2215 11/01/19 0645  PROCALCITON  --   --  <0.10  --   --   WBC 17.1*  --  23.2*  --  18.4*  LATICACIDVEN  --  1.8 2.3* 3.0*  --     Liver Function Tests: Recent Labs  Lab 10/28/2019 1251 11/03/2019 1924  AST 42* 38  ALT 54* 52*  ALKPHOS 64 63  BILITOT 1.0 1.1  PROT 7.6 6.6  ALBUMIN 2.8* 2.6*   No results for input(s):  LIPASE, AMYLASE in the last 168 hours. No results for input(s): AMMONIA in the last 168 hours.  ABG    Component Value Date/Time   PHART 7.308 (L) 11/01/2019 0531   PCO2ART 44.1 11/01/2019 0531   PO2ART 69.0 (L) 11/01/2019 0531   HCO3 22.1 11/01/2019 0531   TCO2 23 11/01/2019 0531   ACIDBASEDEF 4.0 (H) 11/01/2019 0531   O2SAT 92.0 11/01/2019 0531     Coagulation Profile: No results for input(s): INR, PROTIME in the last 168 hours.  Cardiac Enzymes: No results for input(s): CKTOTAL, CKMB, CKMBINDEX, TROPONINI in the last 168 hours.  HbA1C: No results found for: HGBA1C  CBG: No results for input(s): GLUCAP in the last 168 hours.   Critical care time: The patient is critically ill with multiple organ systems failure and requires high complexity decision making for assessment and support, frequent evaluation and titration of therapies, application  of advanced monitoring technologies and extensive interpretation of multiple databases.  Critical care time 37 mins. This represents my time independent of the NP's/PA's/med students/residents time taking care of the pt. This is excluding procedures.     [facility] 11/01/2019, 8:00 AM

## 2019-11-01 NOTE — Progress Notes (Signed)
Pt's head turned and arms rotated.  ETT secured with cloth tape.  No complications.

## 2019-11-02 ENCOUNTER — Inpatient Hospital Stay (HOSPITAL_COMMUNITY): Payer: Medicare Other

## 2019-11-02 ENCOUNTER — Other Ambulatory Visit: Payer: Self-pay

## 2019-11-02 ENCOUNTER — Encounter (HOSPITAL_COMMUNITY): Payer: Self-pay | Admitting: Critical Care Medicine

## 2019-11-02 DIAGNOSIS — L8990 Pressure ulcer of unspecified site, unspecified stage: Secondary | ICD-10-CM | POA: Insufficient documentation

## 2019-11-02 LAB — COMPREHENSIVE METABOLIC PANEL
ALT: 42 U/L (ref 0–44)
AST: 32 U/L (ref 15–41)
Albumin: 2.3 g/dL — ABNORMAL LOW (ref 3.5–5.0)
Alkaline Phosphatase: 64 U/L (ref 38–126)
Anion gap: 7 (ref 5–15)
BUN: 24 mg/dL — ABNORMAL HIGH (ref 8–23)
CO2: 21 mmol/L — ABNORMAL LOW (ref 22–32)
Calcium: 7.8 mg/dL — ABNORMAL LOW (ref 8.9–10.3)
Chloride: 106 mmol/L (ref 98–111)
Creatinine, Ser: 0.37 mg/dL — ABNORMAL LOW (ref 0.44–1.00)
GFR calc Af Amer: >60 mL/min (ref >60)
GFR calc non Af Amer: >60 mL/min (ref >60)
Glucose, Bld: 134 mg/dL — ABNORMAL HIGH (ref 70–99)
Potassium: 4.5 mmol/L (ref 3.5–5.1)
Sodium: 134 mmol/L — ABNORMAL LOW (ref 135–145)
Total Bilirubin: 0.8 mg/dL (ref 0.3–1.2)
Total Protein: 5.8 g/dL — ABNORMAL LOW (ref 6.5–8.1)

## 2019-11-02 LAB — CBC WITH DIFFERENTIAL/PLATELET
Abs Immature Granulocytes: 0.18 10*3/uL — ABNORMAL HIGH (ref 0.00–0.07)
Basophils Absolute: 0 10*3/uL (ref 0.0–0.1)
Basophils Relative: 0 %
Eosinophils Absolute: 0 10*3/uL (ref 0.0–0.5)
Eosinophils Relative: 0 %
HCT: 35.1 % — ABNORMAL LOW (ref 36.0–46.0)
Hemoglobin: 11 g/dL — ABNORMAL LOW (ref 12.0–15.0)
Immature Granulocytes: 1 %
Lymphocytes Relative: 5 %
Lymphs Abs: 0.8 10*3/uL (ref 0.7–4.0)
MCH: 29.1 pg (ref 26.0–34.0)
MCHC: 31.3 g/dL (ref 30.0–36.0)
MCV: 92.9 fL (ref 80.0–100.0)
Monocytes Absolute: 1 10*3/uL (ref 0.1–1.0)
Monocytes Relative: 6 %
Neutro Abs: 15.5 10*3/uL — ABNORMAL HIGH (ref 1.7–7.7)
Neutrophils Relative %: 88 %
Platelets: 199 10*3/uL (ref 150–400)
RBC: 3.78 MIL/uL — ABNORMAL LOW (ref 3.87–5.11)
RDW: 15.3 % (ref 11.5–15.5)
WBC: 17.5 10*3/uL — ABNORMAL HIGH (ref 4.0–10.5)
nRBC: 0 % (ref 0.0–0.2)

## 2019-11-02 LAB — MAGNESIUM: Magnesium: 1.9 mg/dL (ref 1.7–2.4)

## 2019-11-02 LAB — PHOSPHORUS: Phosphorus: 2.5 mg/dL (ref 2.5–4.6)

## 2019-11-02 LAB — GLUCOSE, CAPILLARY
Glucose-Capillary: 129 mg/dL — ABNORMAL HIGH (ref 70–99)
Glucose-Capillary: 131 mg/dL — ABNORMAL HIGH (ref 70–99)
Glucose-Capillary: 147 mg/dL — ABNORMAL HIGH (ref 70–99)
Glucose-Capillary: 170 mg/dL — ABNORMAL HIGH (ref 70–99)
Glucose-Capillary: 182 mg/dL — ABNORMAL HIGH (ref 70–99)
Glucose-Capillary: 86 mg/dL (ref 70–99)

## 2019-11-02 LAB — LEGIONELLA PNEUMOPHILA SEROGP 1 UR AG: L. pneumophila Serogp 1 Ur Ag: NEGATIVE

## 2019-11-02 LAB — URINE CULTURE: Culture: NO GROWTH

## 2019-11-02 LAB — FERRITIN: Ferritin: 309 ng/mL — ABNORMAL HIGH (ref 11–307)

## 2019-11-02 LAB — C-REACTIVE PROTEIN: CRP: 10.5 mg/dL — ABNORMAL HIGH (ref <1.0)

## 2019-11-02 LAB — D-DIMER, QUANTITATIVE: D-Dimer, Quant: 3.55 ug/mL-FEU — ABNORMAL HIGH (ref 0.00–0.50)

## 2019-11-02 LAB — TRIGLYCERIDES: Triglycerides: 102 mg/dL (ref <150)

## 2019-11-02 MED ORDER — FUROSEMIDE 10 MG/ML IJ SOLN
40.0000 mg | Freq: Two times a day (BID) | INTRAMUSCULAR | Status: AC
  Administered 2019-11-02 (×2): 40 mg via INTRAVENOUS
  Filled 2019-11-02 (×2): qty 4

## 2019-11-02 MED ORDER — MIDAZOLAM BOLUS VIA INFUSION
1.0000 mg | INTRAVENOUS | Status: DC | PRN
  Filled 2019-11-02: qty 2

## 2019-11-02 MED ORDER — FREE WATER
200.0000 mL | Freq: Three times a day (TID) | Status: DC
  Administered 2019-11-02 – 2019-11-03 (×3): 200 mL

## 2019-11-02 MED ORDER — INSULIN ASPART 100 UNIT/ML ~~LOC~~ SOLN
0.0000 [IU] | SUBCUTANEOUS | Status: DC
  Administered 2019-11-02: 1 [IU] via SUBCUTANEOUS
  Administered 2019-11-02: 22:00:00 2 [IU] via SUBCUTANEOUS
  Administered 2019-11-03: 3 [IU] via SUBCUTANEOUS
  Administered 2019-11-03: 12:00:00 1 [IU] via SUBCUTANEOUS

## 2019-11-02 MED ORDER — MIDAZOLAM 50MG/50ML (1MG/ML) PREMIX INFUSION
0.0000 mg/h | INTRAVENOUS | Status: DC
  Administered 2019-11-02: 18:00:00 4 mg/h via INTRAVENOUS
  Administered 2019-11-02: 2 mg/h via INTRAVENOUS
  Administered 2019-11-03: 10 mg/h via INTRAVENOUS
  Administered 2019-11-03: 14:00:00 5 mg/h via INTRAVENOUS
  Filled 2019-11-02 (×5): qty 50

## 2019-11-02 NOTE — Progress Notes (Addendum)
Courtney Jefferson remains sedated with propofol 25mcg/kg/min and Fentanyl 163mcg/h.  Her RASS is -2. Upon repositioning of patient she moves her arms from her side to her head but shows no signs of attempts to pull her ETT.  Her vitals are WDL SBP of mid 80's and MAP above 65.  She is ventilated on PRVC 80% FiO2 Peep 10 Rate 34 and tidal volume of 310 saturating around 94%.  Her peripheral pulses to Bilateral upper and lower extremities are weak.  Her feet upon assessment are cold so I placed socks on her feet and covered her with a new thick blanket though her bladder temp from indwelling foley catheter remarks core temp of 37.0c. Her tube feeding is running at goal of 33ml/hr Vital HP.  Her lung sounds are clear with crackles on the right upper and lower side accompanied with subq emphysema.  Her heart sounds are S1S2 with no advantageous breath sounds.  Her foley output for the 0800 is 174ml clear and yellow.

## 2019-11-02 NOTE — Progress Notes (Addendum)
NAME:  Courtney Jefferson, MRN:  [id number], DOB:  Dec 20, 1940, LOS: 2 ADMISSION DATE:  10/13/2019, CONSULTATION DATE:  10/08/2019 REFERRING MD:  osh, CHIEF COMPLAINT:  Acute hypoxemic resp failure  Brief History   79 yo with new dx of covid on 1/7, recent hospitalization 1/13-1/18 with covid, represented with worsening hypoxemia req intubation.   History of present illness   79 yo with recent dx of covid on 1/7 was treated at home until she required hospitalization on 1/13. She completed 5 days remdesivir and 5 days steroids and was discharged with 5 days of steroids as well as on 5L [place]. She represented today after being found hypoxic to 62 with sob. She was placed on NRB with stalled improvement to 70s after breathing therapies she was able to improve to 80's but subsequently req intubation. She was given abx at time of presentation there 2/2 worsening infiltrates and wbc. She was also resumed on decadron.   Past Medical History   Past Medical History:  Diagnosis Date  . Cancer [facility])    cancer of colon  . Hypertension   . PONV (postoperative nausea and vomiting)     Significant Hospital Events   Intubated 1/27 1/28 pneumomediastinum and new small right pneumothorax.  In discussions with family decision made to defer chest tube, change CODE STATUS to DNR but continue current medical care  Consults:    Procedures:  ETT 1/27 >>   Significant Diagnostic Tests:  1/27: ctpa: 1. Respiratory motion artifact degrades evaluation of the segmental and subsegmental branch pulmonary arteries. No central, main, or lobar branch filling defect is identified to suggest pulmonary embolism. 2. Extensive mixed density airspace opacities throughout both lungs with basilar predominance, compatible with multifocal pneumonia. Findings have progressed from prior study. 3. Mildly enlarged mediastinal and hilar lymph nodes, likely reactive. 4. Endotracheal tube terminates just proximal to the  carina. Recommend retraction approximately 2-3 cm.   Micro Data:  1/7: sars2: postive 1/27 blood: NGTD 1/28 resp:  1/28 urine: Negative  Antimicrobials:  Vancomycin 1/27>> 1/29 Cefepime 1/27>> Remdesivir completed 1/13-1/18  Interim history/subjective:  Chest x-ray 1/29 reviewed by me, stable mediastinal air and small apical right pneumothorax Fentanyl 100, propofol 30 fiO2 0.80, PEEP 10   Objective   Blood pressure (!) 89/56, pulse 88, temperature 98.6 F (37 C), temperature source Bladder, resp. rate (!) 34, height 5' (1.524 m), weight 52.7 kg, SpO2 96 %.    Vent Mode: PRVC FiO2 (%):  [80 %] 80 % Set Rate:  [34 bmp] 34 bmp Vt Set:  [310 mL] 310 mL PEEP:  [10 cmH20-12 cmH20] 10 cmH20   Intake/Output Summary (Last 24 hours) at 11/02/2019 0850 Last data filed at 11/02/2019 0800 Gross per 24 hour  Intake 1532.49 ml  Output 830 ml  Net 702.49 ml   Filed Weights   11/02/19 0422 11/02/19 0610  Weight: 52.7 kg 52.7 kg    Examination: General: sedated, ill appearing HEENT: ETT in place Lungs: Coarse, no wheeze Cardiovascular: regular, borderline tachy Abdomen: non-distended, + BS hypoactive Extremities: 1+ edema Skin: no rashes, warm and dry Neuro: moves ext with stim  Resolved Hospital Problem list     Assessment & Plan:  Acute hypoxemic resp failure with covid 19: Possible superimposed HCAP -dx'd 1/7 (recent hospitalization 1/13-1/18) -completed remdesivir during original hospitalization Mechanical ventilation via ARDS protocol, target PRVC 6 cc/kg Wean PEEP and FiO2 as able.  Minimizing PEEP currently given the pneumomediastinum, right apical pneumothorax Goal plateau pressure less than  30, driving pressure less than 15 Paralytics if necessary for vent synchrony, gas exchange Cycle prone positioning as she is able to tolerate Deep sedation per PAD protocol, goal RASS -4, currently fentanyl, propofol infusions.  Consider transition to Versed from propofol  1/29 Diuresis as blood pressure and renal function can tolerate, goal CVP 5-8.  Lasix given 1/29, dose daily VAP prevention order set Dexamethasone reinitiated when readmitted given severity of illness.  Duration of therapy unclear, consider complete another full 10 days Empiric cefepime for possible HCAP, follow culture data. Stop vanco on 1/29 with negative MRSA screen Enoxaparin anticoagulation/prophylaxis  Pneumomediastinum, small right pneumothorax Follow serial chest x-rays.  Note Dr. Marcheta Grammes discussion with family on 1/28.  Plan for continued aggressive care but no escalation.  Family wanted to defer chest tube.  Support this decision.  If she has worsening pneumothorax then may transition to comfort based approach. Judicious PEEP  Sepsis 2/2 above:  As above  Lactic acidosis:  Following for clearance  Transaminitis:  Following LFT  Best practice:  Diet: npo Pain/Anxiety/Delirium protocol (if indicated): per protocol VAP protocol (if indicated): per protocol DVT prophylaxis: heparin GI prophylaxis: famotidine Glucose control: monitor Mobility: bed  Code Status: DNR Family Communication: Dr Ruthann Cancer discussed Code status w family 1/28. I updated daughter Loletha Carrow on status 1/29.  Disposition: ICU  Labs   CBC: Recent Labs  Lab 10/22/2019 1251 11/03/2019 1251 10/30/2019 1924 11/02/2019 1924 10/07/2019 2111 11/01/19 0531 11/01/19 0645 11/01/19 1702 11/02/19 0340  WBC 17.1*  --  23.2*  --   --   --  18.4*  --  17.5*  NEUTROABS 14.0*  --  21.7*  --   --   --  15.5*  --  15.5*  HGB 14.3   < > 13.1   < > 11.2* 11.6* 12.0 11.9* 11.0*  HCT 44.7   < > 41.0   < > 33.0* 34.0* 37.9 35.0* 35.1*  MCV 88.7  --  91.7  --   --   --  92.0  --  92.9  PLT 326  --  223  --   --   --  239  --  199   < > = values in this interval not displayed.    Basic Metabolic Panel: Recent Labs  Lab 10/28/2019 1251 10/27/2019 1251 11/03/2019 1924 11/02/2019 1924 10/23/2019 2111 11/01/19 0531  11/01/19 0645 11/01/19 1702 11/02/19 0340  NA 137   < > 138   < > 138 137 140 138 134*  K 3.9   < > 4.7   < > 4.6 4.5 5.1 4.5 4.5  CL 102  --  105  --   --   --  110  --  106  CO2 25  --  22  --   --   --  22  --  21*  GLUCOSE 148*  --  154*  --   --   --  90  --  134*  BUN 19  --  16  --   --   --  17  --  24*  CREATININE 0.71  --  0.56  --   --   --  0.56  --  0.37*  CALCIUM 9.1  --  8.3*  --   --   --  8.7*  --  7.8*  MG  --   --   --   --   --   --  2.1  --  1.9  PHOS  --   --   --   --   --   --  3.9  --  2.5   < > = values in this interval not displayed.   GFR: Estimated Creatinine Clearance: 41.6 mL/min (A) (by C-G formula based on SCr of 0.37 mg/dL (L)). Recent Labs  Lab 10/11/2019 1251 10/09/2019 1346 10/30/2019 1924 10/22/2019 2215 11/01/19 0645 11/01/19 0900 11/01/19 1200 11/02/19 0340  PROCALCITON  --   --  <0.10  --   --   --   --   --   WBC 17.1*  --  23.2*  --  18.4*  --   --  17.5*  LATICACIDVEN  --    < > 2.3* 3.0*  --  1.4 1.4  --    < > = values in this interval not displayed.    Liver Function Tests: Recent Labs  Lab 10/25/2019 1251 10/11/2019 1924 11/01/19 0645 11/02/19 0340  AST 42* 38 36 32  ALT 54* 52* 43 42  ALKPHOS 64 63 68 64  BILITOT 1.0 1.1 0.7 0.8  PROT 7.6 6.6 6.3* 5.8*  ALBUMIN 2.8* 2.6* 2.5* 2.3*   No results for input(s): LIPASE, AMYLASE in the last 168 hours. No results for input(s): AMMONIA in the last 168 hours.  ABG    Component Value Date/Time   PHART 7.314 (L) 11/01/2019 1702   PCO2ART 45.9 11/01/2019 1702   PO2ART 54.0 (L) 11/01/2019 1702   HCO3 23.2 11/01/2019 1702   TCO2 25 11/01/2019 1702   ACIDBASEDEF 3.0 (H) 11/01/2019 1702   O2SAT 84.0 11/01/2019 1702     Coagulation Profile: No results for input(s): INR, PROTIME in the last 168 hours.  Cardiac Enzymes: No results for input(s): CKTOTAL, CKMB, CKMBINDEX, TROPONINI in the last 168 hours.  HbA1C: No results found for: HGBA1C  CBG: Recent Labs  Lab 11/01/19 0920  11/01/19 2009 11/01/19 2327 11/02/19 0302 11/02/19 0736  GLUCAP 73 140* 180* 131* 129*     Critical care time: The patient is critically ill with multiple organ systems failure and requires high complexity decision making for assessment and support, frequent evaluation and titration of therapies, application of advanced monitoring technologies and extensive interpretation of multiple databases.  Critical care time 34 mins. This represents my time independent of the NP's/PA's/med students/residents time taking care of the pt. This is excluding procedures.     Baltazar Apo, MD, PhD 11/02/2019, 9:13 AM [facility] [phone] or if no answer [phone]

## 2019-11-02 NOTE — Progress Notes (Signed)
Increasing agitation noted. Patient keeps moving BUE up and down. Patient shakes head no when asked if she is in pain. Will keep reorienting and attempting to calm patient. Titrating propofol and fentanyl to reach RASS -1.

## 2019-11-03 DIAGNOSIS — J939 Pneumothorax, unspecified: Secondary | ICD-10-CM

## 2019-11-03 LAB — COMPREHENSIVE METABOLIC PANEL
ALT: 33 U/L (ref 0–44)
AST: 22 U/L (ref 15–41)
Albumin: 2.2 g/dL — ABNORMAL LOW (ref 3.5–5.0)
Alkaline Phosphatase: 51 U/L (ref 38–126)
Anion gap: 8 (ref 5–15)
BUN: 28 mg/dL — ABNORMAL HIGH (ref 8–23)
CO2: 26 mmol/L (ref 22–32)
Calcium: 7.9 mg/dL — ABNORMAL LOW (ref 8.9–10.3)
Chloride: 103 mmol/L (ref 98–111)
Creatinine, Ser: 0.5 mg/dL (ref 0.44–1.00)
GFR calc Af Amer: >60 mL/min (ref >60)
GFR calc non Af Amer: >60 mL/min (ref >60)
Glucose, Bld: 146 mg/dL — ABNORMAL HIGH (ref 70–99)
Potassium: 3.6 mmol/L (ref 3.5–5.1)
Sodium: 137 mmol/L (ref 135–145)
Total Bilirubin: 0.4 mg/dL (ref 0.3–1.2)
Total Protein: 5.9 g/dL — ABNORMAL LOW (ref 6.5–8.1)

## 2019-11-03 LAB — CBC WITH DIFFERENTIAL/PLATELET
Abs Immature Granulocytes: 0.11 10*3/uL — ABNORMAL HIGH (ref 0.00–0.07)
Basophils Absolute: 0 10*3/uL (ref 0.0–0.1)
Basophils Relative: 0 %
Eosinophils Absolute: 0 10*3/uL (ref 0.0–0.5)
Eosinophils Relative: 0 %
HCT: 33.7 % — ABNORMAL LOW (ref 36.0–46.0)
Hemoglobin: 10.3 g/dL — ABNORMAL LOW (ref 12.0–15.0)
Immature Granulocytes: 1 %
Lymphocytes Relative: 6 %
Lymphs Abs: 0.6 10*3/uL — ABNORMAL LOW (ref 0.7–4.0)
MCH: 28.4 pg (ref 26.0–34.0)
MCHC: 30.6 g/dL (ref 30.0–36.0)
MCV: 92.8 fL (ref 80.0–100.0)
Monocytes Absolute: 0.6 10*3/uL (ref 0.1–1.0)
Monocytes Relative: 5 %
Neutro Abs: 9.6 10*3/uL — ABNORMAL HIGH (ref 1.7–7.7)
Neutrophils Relative %: 88 %
Platelets: 175 10*3/uL (ref 150–400)
RBC: 3.63 MIL/uL — ABNORMAL LOW (ref 3.87–5.11)
RDW: 15.3 % (ref 11.5–15.5)
WBC: 10.9 10*3/uL — ABNORMAL HIGH (ref 4.0–10.5)
nRBC: 0 % (ref 0.0–0.2)

## 2019-11-03 LAB — FERRITIN: Ferritin: 295 ng/mL (ref 11–307)

## 2019-11-03 LAB — C-REACTIVE PROTEIN: CRP: 12.5 mg/dL — ABNORMAL HIGH (ref <1.0)

## 2019-11-03 LAB — PHOSPHORUS: Phosphorus: 2.7 mg/dL (ref 2.5–4.6)

## 2019-11-03 LAB — GLUCOSE, CAPILLARY
Glucose-Capillary: 217 mg/dL — ABNORMAL HIGH (ref 70–99)
Glucose-Capillary: 114 mg/dL — ABNORMAL HIGH (ref 70–99)
Glucose-Capillary: 142 mg/dL — ABNORMAL HIGH (ref 70–99)

## 2019-11-03 LAB — HEMOGLOBIN A1C
Hgb A1c MFr Bld: 6.5 % — ABNORMAL HIGH (ref 4.8–5.6)
Mean Plasma Glucose: 139.85 mg/dL

## 2019-11-03 LAB — TRIGLYCERIDES: Triglycerides: 55 mg/dL (ref <150)

## 2019-11-03 LAB — MAGNESIUM: Magnesium: 1.8 mg/dL (ref 1.7–2.4)

## 2019-11-03 LAB — D-DIMER, QUANTITATIVE: D-Dimer, Quant: 3.47 ug/mL-FEU — ABNORMAL HIGH (ref 0.00–0.50)

## 2019-11-03 MED ORDER — MAGNESIUM SULFATE 2 GM/50ML IV SOLN
2.0000 g | Freq: Once | INTRAVENOUS | Status: AC
  Administered 2019-11-03: 2 g via INTRAVENOUS
  Filled 2019-11-03: qty 50

## 2019-11-03 MED ORDER — GLYCOPYRROLATE 1 MG PO TABS
1.0000 mg | ORAL_TABLET | ORAL | Status: DC | PRN
  Filled 2019-11-03: qty 1

## 2019-11-03 MED ORDER — GLYCOPYRROLATE 0.2 MG/ML IJ SOLN: 0.2000 mg | INTRAMUSCULAR | Status: DC | PRN

## 2019-11-03 MED ORDER — MORPHINE SULFATE (PF) 2 MG/ML IV SOLN
2.0000 mg | INTRAVENOUS | Status: DC | PRN
  Administered 2019-11-03 (×2): 4 mg via INTRAVENOUS
  Filled 2019-11-03 (×2): qty 2

## 2019-11-03 MED ORDER — DEXTROSE 5 % IV SOLN: INTRAVENOUS | Status: DC

## 2019-11-03 MED ORDER — DIPHENHYDRAMINE HCL 50 MG/ML IJ SOLN: 25.0000 mg | INTRAMUSCULAR | Status: DC | PRN

## 2019-11-03 MED ORDER — POTASSIUM CHLORIDE 20 MEQ/15ML (10%) PO SOLN
20.0000 meq | ORAL | Status: AC
  Administered 2019-11-03 (×2): 20 meq
  Filled 2019-11-03 (×2): qty 15

## 2019-11-03 MED ORDER — ACETAMINOPHEN 325 MG PO TABS: 650.0000 mg | ORAL_TABLET | Freq: Four times a day (QID) | ORAL | Status: DC | PRN

## 2019-11-03 MED ORDER — POLYVINYL ALCOHOL 1.4 % OP SOLN
1.0000 [drp] | Freq: Four times a day (QID) | OPHTHALMIC | Status: DC | PRN
  Filled 2019-11-03: qty 15

## 2019-11-03 MED ORDER — ACETAMINOPHEN 650 MG RE SUPP: 650.0000 mg | Freq: Four times a day (QID) | RECTAL | Status: DC | PRN

## 2019-11-03 NOTE — Progress Notes (Signed)
Patient's daughter called and was given an update on her mother's condition and plan of care for today. Discussed how Courtney Jefferson's oxygen and ventilatory requirements have increased over the past couple of hours. She will discuss this change with her other family members. Daughter was assured that a physician would be in contact following rounds to discuss plans further.

## 2019-11-03 NOTE — Progress Notes (Signed)
Pt transported by RT & RN for family visit from 205-1 and back without any complications.

## 2019-11-03 NOTE — Progress Notes (Signed)
WUA not possible thi AM as patient not following commands and became very agitated fougbht the vent this am around 0630 Sedation maxed out cycling BPs

## 2019-11-03 NOTE — Progress Notes (Signed)
Ms Courtney Jefferson was taken down to visit with her two daughters, Courtney Jefferson and Courtney Jefferson. Family wishes to proceed with comfort measures only. Dr Elsworth Soho has placed orders for comfort care, including extubation and Morphine gtt.

## 2019-11-03 NOTE — Progress Notes (Signed)
NAME:  Courtney Jefferson, MRN:  [id number], DOB:  [birth year], LOS: 3 ADMISSION DATE:  10/28/2019, CONSULTATION DATE:  10/24/2019 REFERRING MD:  osh, CHIEF COMPLAINT:  Acute hypoxemic resp failure  Brief History   79 yo with new dx of covid on 1/7, recent hospitalization 1/13-1/18 with covid,discharged with 5 days of steroids on 5L [place] re -presented with worsening hypoxemia req intubation.  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 Cancer [facility])    cancer of colon  . Hypertension   . PONV (postoperative nausea and vomiting)     Significant Hospital Events   Intubated 1/27 1/28 pneumomediastinum and new small right pneumothorax.  In discussions with family decision made to defer chest tube, change CODE STATUS to DNR but continue current medical care  Consults:    Procedures:  ETT 1/27 >>   Significant Diagnostic Tests:  1/27: ctpa: 1. Respiratory motion artifact degrades evaluation of the segmental and subsegmental branch pulmonary arteries. No central, main, or lobar branch filling defect is identified to suggest pulmonary embolism. 2. Extensive mixed density airspace opacities throughout both lungs with basilar predominance, compatible with multifocal pneumonia. Findings have progressed from prior study. 3. Mildly enlarged mediastinal and hilar lymph nodes, likely reactive. 4. Endotracheal tube terminates just proximal to the carina. Recommend retraction approximately 2-3 cm.   Micro Data:  1/7: sars2: postive 1/27 blood: NGTD 1/28 resp: ng 1/28 urine: Negative  Antimicrobials:  Vancomycin 1/27>> 1/29 Cefepime 1/27>> Remdesivir completed 1/13-1/18  Interim history/subjective:   O2 requirements have increased overnight, now 100%/PEEP of 12 Sedated on Versed and fentanyl drips Afebrile Good urine output    Objective   Blood pressure 121/65,  pulse (!) 110, temperature 98.4 F (36.9 C), temperature source Bladder, resp. rate 16, height 5' (1.524 m), weight 57.6 kg, SpO2 (!) 89 %.    Vent Mode: PRVC FiO2 (%):  [60 %-100 %] 100 % Set Rate:  [34 bmp] 34 bmp Vt Set:  [310 mL] 310 mL PEEP:  [10 [id number] cmH20] 12 cmH20 Plateau Pressure:  [21 cmH20-24 cmH20] 21 cmH20   Intake/Output Summary (Last 24 hours) at 11/03/2019 1019 Last data filed at 11/03/2019 0900 Gross per 24 hour  Intake 2135.77 ml  Output 2804 ml  Net -668.23 ml   Filed Weights   11/02/19 0422 11/02/19 0610 11/03/19 0359  Weight: 52.7 kg 52.7 kg 57.6 kg    Examination: General: sedated, critically ill, intubated, elderly woman HEENT: ETT in place, mild pallor, no icterus, no JVD Lungs: Decreased breath sounds on right, subacute emphysema with crepitus in right chest wall and neck Cardiovascular: Swan S2 irregular, tacky Abdomen: non-distended, + BS hypoactive Extremities: 1+ edema Skin: no rashes, warm and dry Neuro: Sedated, RASS -4  Chest x-ray 1/29 personally reviewed-slight increase in right-sided pneumothorax, stable pneumomediastinum and extensive right-sided subcutaneous emphysema  Labs show mild hypokalemia, decrease leukocytosis, stable anemia  Resolved Hospital Problem list   Lactic acidosis: cleared  Transaminitis   Assessment & Plan:  Acute hypoxemic resp failure with covid 19: Possible superimposed HCAP -dx'd 1/7 (recent hospitalization 1/13-1/18) -completed remdesivir during original hospitalization -Dexamethasone for 10 days total Mechanical ventilation via ARDS protocol, target PRVC 6 cc/kg -100%/PEEP of 12 minimizing PEEP currently given the pneumomediastinum, right apical pneumothorax Goal plateau pressure less than 30, driving pressure less than 15   Pneumomediastinum, Right pneumothorax -Keep PEEP at 12 -No chest tube  per discussion with family on 1/28 and again today   Goals of care-continue discussion with daughter  [name] today, her husband was present.  Explained worsening hypoxia and chest tube placement was discussed, she does not want mom to have any more painful procedures. Family would like to visit and afterwards proceed to comfort care    Best practice:  Diet: npo Pain/Anxiety/Delirium protocol (if indicated): per protocol VAP protocol (if indicated): per protocol DVT prophylaxis: heparin GI prophylaxis: famotidine Glucose control: monitor Mobility: bed  Code Status: DNR Family Communication:  daughter Loletha Carrow 1/30  Disposition: ICU   The patient is critically ill with multiple organ systems failure and requires high complexity decision making for assessment and support, frequent evaluation and titration of therapies, application of advanced monitoring technologies and extensive interpretation of multiple databases. Critical Care Time devoted to patient care services described in this note independent of APP/resident  time is 35 minutes.   Kara Mead MD. [place]. [facility]  If no response to pager , please call [phone]   11/03/2019

## 2019-11-04 LAB — CULTURE, RESPIRATORY W GRAM STAIN
Culture: NO GROWTH
Gram Stain: NONE SEEN

## 2019-11-05 ENCOUNTER — Telehealth: Payer: Self-pay | Admitting: Internal Medicine

## 2019-11-05 LAB — CULTURE, BLOOD (ROUTINE X 2)
Culture: NO GROWTH
Culture: NO GROWTH
Special Requests: ADEQUATE
Special Requests: ADEQUATE

## 2019-11-05 NOTE — Progress Notes (Signed)
[facility] Progress Note Patient Name: Courtney Jefferson DOB: March 06, 1941 MRN: [id number]   Date of Service  2019/11/23  HPI/Events of Note  Notified of patient's passing.  Pt was a DNR.  Time of death - 47 on November 23, 2019.  eICU Interventions       Intervention Category Minor Interventions: Other:  [name] 11/23/19, 3:03 AM

## 2019-11-05 NOTE — Telephone Encounter (Signed)
I called patient's daughter, Norma Fredrickson offer my condolences.  Left voicemail.

## 2019-11-05 NOTE — Progress Notes (Signed)
RN notified E-link that patient expired at 02:42 with confirmation from 2 RN's (Meilin Brosh and Gerald Stabs), Family aware and Team Leader aware.

## 2019-11-05 NOTE — Death Summary Note (Addendum)
Death Summary  Courtney Jefferson [id number] DOB: 02/09/1941 DOA: Nov 07, 2019  PCP: Idelle Crouch, MD  Admit date: 2019-11-07 Date of Death: 2019/11/11 Time of Death: 02:42 Notification: Idelle Crouch, MD notified of death of 11-11-2019   History of present illness:  Courtney Jefferson is a 79 y.o. female with a history of metastatic colon cancer on chemotherapy, as well as hypertension, diagnosed with COVID-19 on 10/11/2019 after a week of shortness of breath and cough, admitted to the hospital with new supplemental oxygen requirement on 10/17/2019, also treated for E. coli UTI during that admission, and discharged on 10/22/2019 with 5 L/min of supplemental oxygen, who unfortunately worsened acutely and return to the ED on 2019/11/07 in acute respiratory distress requiring intubation and mechanical ventilation.  Patient was intubated in the emergency department and admitted to [facility] service at [facility].  She had been treated with steroids and remdesivir.  Unfortunately, her condition continued to worsen and course was complicated by right pneumothorax and pneumomediastinum.  Her family was kept abreast of these developments, did not feel that the patient would want to undergo any further invasive procedures such as chest tube, and expressed that the patient would want [place] to focus on comfort measures only at this juncture.  On 11/03/2019, family was able to visit Courtney Jefferson and asked that we focus on comfort only, including extubation, understanding that she would not survive.  Goal of care was transitioned to comfort measures only, and the patient expired early in the morning of November 11, 2019.  Final Diagnoses:  1.  ARDS  2.  COVID-19 pneumonia  3.  Metastatic colon cancer       The results of significant diagnostics from this hospitalization (including imaging, microbiology, ancillary and laboratory) are listed below for reference.    Significant Diagnostic Studies: CT Angio Chest PE  W and/or Wo Contrast  Result Date: 11-07-2019 CLINICAL DATA:  Respiratory distress, hypoxia, COVID-19 positive. History of colorectal cancer. EXAM: CT ANGIOGRAPHY CHEST WITH CONTRAST TECHNIQUE: Multidetector CT imaging of the chest was performed using the standard protocol during bolus administration of intravenous contrast. Multiplanar CT image reconstructions and MIPs were obtained to evaluate the vascular anatomy. CONTRAST:  49mL OMNIPAQUE IOHEXOL 350 MG/ML SOLN COMPARISON:  CT 10/18/2019 FINDINGS: Cardiovascular: Pulmonary vasculature is well opacified. Excessive respiratory motion degrades evaluation of the segmental and subsegmental branch pulmonary arteries. No central, main, or lobar branch filling defect is identified. Heart size is within normal limits. No pericardial effusion. Thoracic aorta is nonaneurysmal. Left-sided chest port terminates at the superior cavoatrial junction. Mediastinum/Nodes: Mildly enlarged precarinal and subcarinal lymph nodes, likely reactive. Mild soft tissue fullness in the right hilum also likely reflecting reactive lymph nodes. No axillary lymphadenopathy. In endotracheal tube terminates just proximal to the carina. An enteric tube descends the esophagus with distal tip terminating beyond the inferior margin of the study. Thyroid gland unremarkable. Lungs/Pleura: Extensive mixed density airspace opacities throughout both lungs with basilar predominance, which have progressed from prior. Opacities within the upper lung fields are predominantly peripheral in distribution. No pleural effusion. No pneumothorax. 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 No acute findings within the visualized upper abdomen Musculoskeletal: No chest wall abnormality. No acute or significant osseous findings. Review of the MIP images confirms the above findings. IMPRESSION: 1. Respiratory motion  artifact degrades evaluation of the segmental and subsegmental branch pulmonary arteries. No central, main, or lobar branch filling  defect is identified to suggest pulmonary embolism. 2. Extensive mixed density airspace opacities throughout both lungs with basilar predominance, compatible with multifocal pneumonia. Findings have progressed from prior study. 3. Mildly enlarged mediastinal and hilar lymph nodes, likely reactive. 4. Endotracheal tube terminates just proximal to the carina. Recommend retraction approximately 2-3 cm. These results including the location of the ET tube were called by telephone at the time of interpretation on 10/20/2019 at 2:51 pm to provider Joni Fears, who verbally acknowledged these results. Electronically Signed   By: Davina Poke D.O.   On: 10/05/2019 14:52   CT ANGIO CHEST PE W OR WO CONTRAST  Result Date: 10/18/2019 CLINICAL DATA:  Shortness of breath, elevated D-dimer level. EXAM: CT ANGIOGRAPHY CHEST WITH CONTRAST TECHNIQUE: Multidetector CT imaging of the chest was performed using the standard protocol during bolus administration of intravenous contrast. Multiplanar CT image reconstructions and MIPs were obtained to evaluate the vascular anatomy. CONTRAST:  4mL OMNIPAQUE IOHEXOL 350 MG/ML SOLN COMPARISON:  None. FINDINGS: Cardiovascular: Satisfactory opacification of the pulmonary arteries to the segmental level. No evidence of pulmonary embolism. Normal heart size. No pericardial effusion. Mediastinum/Nodes: No enlarged mediastinal, hilar, or axillary lymph nodes. Thyroid gland, trachea, and esophagus demonstrate no significant findings. Lungs/Pleura: No pneumothorax or pleural effusion is noted. Bilateral lower lobe airspace opacities are noted concerning for multifocal pneumonia. Mild right upper lobe opacity is noted as well. Upper Abdomen: No acute abnormality. Musculoskeletal: No chest wall abnormality. No acute or significant osseous findings. Review of the MIP  images confirms the above findings. IMPRESSION: No definite evidence of pulmonary embolus. Multiple airspace opacities are noted bilaterally consistent with multifocal pneumonia. Electronically Signed   By: Marijo Conception M.D.   On: 10/18/2019 10:20   DG CHEST PORT 1 VIEW  Result Date: 11/02/2019 CLINICAL DATA:  Endotracheal tube present. Pneumothorax. EXAM: PORTABLE CHEST 1 VIEW COMPARISON:  One-view chest x-ray 11/02/2019 at 5:20 a.m. FINDINGS: The heart size is normal. Left subclavian Port-A-Cath is stable. Endotracheal tube is stable. NG tube courses off the inferior border of the film. Pneumomediastinum is again noted. Right pneumothorax has increased slightly. Extensive right-sided subcutaneous emphysema is present. Bilateral interstitial and airspace opacities are again seen without significant interval change. IMPRESSION: 1. Slight increase in right-sided pneumothorax. 2. Stable pneumomediastinum and extensive right-sided subcutaneous emphysema. 3. Stable bilateral interstitial and airspace disease. Electronically Signed   By: San Morelle M.D.   On: 11/02/2019 12:15   DG CHEST PORT 1 VIEW  Result Date: 11/02/2019 CLINICAL DATA:  COVID-19 positive. Pneumomediastinum/pneumopericardium. EXAM: PORTABLE CHEST 1 VIEW COMPARISON:  11/01/2019.  CT 10/21/2019. FINDINGS: Endotracheal tube, NG tube, left PowerPort catheter in stable position. Pneumomediastinum again appears to be present. No definite pneumopericardium noted. Stable small right pneumothorax. Improved right neck and chest wall subcutaneous emphysema. Heart size stable. Unchanged bilateral multifocal prominent pulmonary infiltrates. No pleural effusion. 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 IMPRESSION: 1.  Lines and tubes in stable position. 2. Pneumomediastinum again appears to be present. No definite pneumopericardium noted on today's exam. Stable small right pneumothorax.  Improved right neck and right chest wall subcutaneous emphysema. 3.  Unchanged bilateral multifocal prominent pulmonary infiltrates. Electronically Signed   By: Marcello Moores  Register   On: 11/02/2019 06:15   DG CHEST PORT 1 VIEW  Result Date: 11/01/2019 CLINICAL DATA:  COVID-19 positivity with suspected pneumomediastinum. EXAM: PORTABLE CHEST 1 VIEW COMPARISON:  10/31/2018 FINDINGS: Endotracheal tube, gastric catheter and left-sided chest wall port are again seen.  Cardiac shadow is stable. Considerable subcutaneous emphysema is noted. There is again suspicion for small lateral pneumothorax on the right. Patchy opacities are again seen in both lungs and stable. The air surrounding the cardiac shadow seen on the prior exam is less present although some changes of minimal mediastinum are seen. IMPRESSION: Considerable increase in subcutaneous emphysema particularly on the right. Changes of pneumomediastinum are seen. This also likely extends into the pericardium although less well visualized than on the prior exam. Patchy airspace opacities consistent with the given clinical history. Tubes and lines as described. Electronically Signed   By: Inez Catalina M.D.   On: 11/01/2019 12:15   DG Chest Port 1 View  Result Date: 11/01/2019 CLINICAL DATA:  Endotracheal tube EXAM: PORTABLE CHEST 1 VIEW COMPARISON:  10/30/2018 FINDINGS: Endotracheal tube is approximately 4 cm above the carina. Left chest wall port catheter tip is unchanged. Bilateral pulmonary opacities are again identified similar to the prior study. No pleural effusion. Question of a small right apical pneumothorax. Stable cardiomediastinal contours. Suspected pneumomediastinum. Subcutaneous air in the right chest wall and neck. IMPRESSION: Lines and tubes as above. New subcutaneous emphysema. Suspected pneumomediastinum. Possible small right apical pneumothorax. Similar bilateral pulmonary opacities. These results will be called to the ordering clinician or  representative by the Radiologist Assistant, and communication documented in the PACS or zVision Dashboard. Electronically Signed   By: Macy Mis M.D.   On: 11/01/2019 08:10   DG Chest Port 1 View  Result Date: 11/03/2019 CLINICAL DATA:  Endotracheal tube placement. EXAM: PORTABLE CHEST 1 VIEW COMPARISON:  October 31, 2019 FINDINGS: An endotracheal tube is seen with its distal tip approximately 5.0 cm from the carina. A nasogastric tube is noted with its distal end overlying the body of the stomach. There is stable left-sided venous Port-A-Cath positioning. Moderate severity bilateral infiltrates are seen involving the mid right lung and bilateral lung bases. These are stable in appearance. There is no evidence of a pleural effusion or pneumothorax. The heart size and mediastinal contours are within normal limits. The visualized skeletal structures are unremarkable. IMPRESSION: 1. Endotracheal tube and nasogastric tube in good position. 2. Stable bilateral infiltrates. Electronically Signed   By: Virgina Norfolk M.D.   On: 10/10/2019 19:22   DG Chest Port 1 View  Result Date: 10/07/2019 CLINICAL DATA:  Endotracheal and OG tube placement. EXAM: PORTABLE CHEST 1 VIEW COMPARISON:  10/17/2019 FINDINGS: 1343 hours. Endotracheal tube tip is 2.2 cm above the base of the carina. The NG tube passes into the stomach although the distal tip position is not included on the film. Left Port-A-Cath tip is positioned over the distal SVC. Lower lung volumes with slight progression of patchy airspace opacity in the right mid and lower lung as well as left base. No evidence for pneumothorax or pleural effusion. The visualized bony structures of the thorax are intact. Telemetry leads overlie the chest. IMPRESSION: 1. Endotracheal and NG tubes as described. 2. Interval progression of patchy bilateral airspace disease, right greater than left compatible with multifocal pneumonia. Electronically Signed   By: Misty Stanley  M.D.   On: 10/22/2019 14:13   DG Chest Portable 1 View  Result Date: 10/17/2019 CLINICAL DATA:  Positive COVID test EXAM: PORTABLE CHEST 1 VIEW COMPARISON:  09/26/2019, 08/25/2019 FINDINGS: Interstitial and ground-glass opacities at the bases superimposed on chronic lung scarring. No pleural effusion. Left-sided central venous catheter with tip over the SVC. Normal heart size. No pneumothorax. IMPRESSION: New interstitial and ground-glass opacity at  the bases, concerning for pneumonia, possible atypical or viral pneumonia. Electronically Signed   By: Donavan Foil M.D.   On: 10/17/2019 21:20   DG Abd Portable 1 View  Result Date: 11/03/2019 CLINICAL DATA:  Tube placement EXAM: PORTABLE ABDOMEN - 1 VIEW COMPARISON:  08/27/2019 FINDINGS: Enteric tube terminates in the distal stomach in the region of the antrum. Partially imaged central line. Bibasilar opacities. Dense calcification overlies the left upper quadrant. Bowel gas pattern is unremarkable. IMPRESSION: Enteric tube terminates in the distal stomach. Electronically Signed   By: Macy Mis M.D.   On: 10/05/2019 14:16    Microbiology: Recent Results (from the past 240 hour(s))  Blood culture (routine x 2)     Status: None (Preliminary result)   Collection Time: 10/05/2019  1:27 PM   Specimen: BLOOD  Result Value Ref Range Status   Specimen Description BLOOD RIGHT ANTECUBITAL  Final   Special Requests   Final    BOTTLES DRAWN AEROBIC AND ANAEROBIC Blood Culture adequate volume   Culture   Final    NO GROWTH 3 DAYS Performed at [facility], [address], [place], [place] [address]    Report Status PENDING  Incomplete  Blood culture (routine x 2)     Status: None (Preliminary result)   Collection Time: 10/10/2019  1:46 PM   Specimen: BLOOD  Result Value Ref Range Status   Specimen Description BLOOD LEFT ANTECUBITAL  Final   Special Requests   Final    BOTTLES DRAWN AEROBIC AND ANAEROBIC Blood Culture adequate volume    Culture   Final    NO GROWTH 3 DAYS Performed at [facility], [address], [place], [place] [address]    Report Status PENDING  Incomplete  Culture, respiratory     Status: None (Preliminary result)   Collection Time: 11/01/19  9:14 AM   Specimen: Tracheal Aspirate; Respiratory  Result Value Ref Range Status   Specimen Description   Final    TRACHEAL ASPIRATE Performed at [facility] [address] [place], [place] [address]    Special Requests   Final    NONE Performed at [facility], [place] [address] [place], [place] [address]    Gram Stain NO WBC SEEN NO ORGANISMS SEEN   Final   Culture   Final    NO GROWTH 1 DAY Performed at [facility], [place] [address], [place], [place] [address]    Report Status PENDING  Incomplete  Culture, Urine     Status: None   Collection Time: 11/01/19  9:27 AM   Specimen: Urine, Random  Result Value Ref Range Status   Specimen Description   Final    URINE, RANDOM Performed at [facility] [address], [place], [place] [address]    Special Requests   Final    NONE Performed at [facility], [place] [address] [place], [place] [address]    Culture   Final    NO GROWTH Performed at [facility], [place] [address], [place], [place] [address]    Report Status 11/02/2019 FINAL  Final  MRSA PCR Screening     Status: None   Collection Time: 11/01/19 11:38 AM   Specimen: Nasopharyngeal  Result Value Ref Range Status   MRSA by PCR NEGATIVE NEGATIVE Final    Comment:        The GeneXpert MRSA Assay (FDA approved for NASAL specimens only), is one  component of a comprehensive MRSA colonization surveillance program. It is not intended to diagnose MRSA infection nor to guide or monitor treatment for MRSA infections. Performed at [facility], [place] [address] [place], [place] [address]      Labs: Basic Metabolic  Panel: Recent Labs  Lab 10/15/2019 1251 10/15/2019 1251 10/27/2019 1924 10/21/2019 2111 11/01/19 0531 11/01/19 0531 11/01/19 0645 11/01/19 0645 11/01/19 1702 11/01/19 1702 11/02/19 0340 11/03/19 0126  NA 137   < > 138   < > 137  --  140  --  138  --  134* 137  K 3.9   < > 4.7   < > 4.5   < > 5.1   < > 4.5   < > 4.5 3.6  CL 102  --  105  --   --   --  110  --   --   --  106 103  CO2 25  --  22  --   --   --  22  --   --   --  21* 26  GLUCOSE 148*  --  154*  --   --   --  90  --   --   --  134* 146*  BUN 19  --  16  --   --   --  17  --   --   --  24* 28*  CREATININE 0.71  --  0.56  --   --   --  0.56  --   --   --  0.37* 0.50  CALCIUM 9.1  --  8.3*  --   --   --  8.7*  --   --   --  7.8* 7.9*  MG  --   --   --   --   --   --  2.1  --   --   --  1.9 1.8  PHOS  --   --   --   --   --   --  3.9  --   --   --  2.5 2.7   < > = values in this interval not displayed.   Liver Function Tests: Recent Labs  Lab 10/18/2019 1251 10/30/2019 1924 11/01/19 0645 11/02/19 0340 11/03/19 0126  AST 42* 38 36 32 22  ALT 54* 52* 43 42 33  ALKPHOS 64 63 68 64 51  BILITOT 1.0 1.1 0.7 0.8 0.4  PROT 7.6 6.6 6.3* 5.8* 5.9*  ALBUMIN 2.8* 2.6* 2.5* 2.3* 2.2*   No results for input(s): LIPASE, AMYLASE in the last 168 hours. No results for input(s): AMMONIA in the last 168 hours. CBC: Recent Labs  Lab 10/08/2019 1251 10/25/2019 1251 10/20/2019 1924 10/09/2019 2111 11/01/19 0531 11/01/19 0645 11/01/19 1702 11/02/19 0340 11/03/19 0126  WBC 17.1*  --  23.2*  --   --  18.4*  --  17.5* 10.9*  NEUTROABS 14.0*  --  21.7*  --   --  15.5*  --  15.5* 9.6*  HGB 14.3   < > 13.1   < > 11.6* 12.0 11.9* 11.0* 10.3*  HCT 44.7   < > 41.0   < > 34.0* 37.9 35.0* 35.1* 33.7*  MCV 88.7  --  91.7  --   --  92.0  --  92.9 92.8  PLT 326  --  223  --   --  239  --  199 175   < > = values in this interval  not displayed.   Cardiac Enzymes: No results for input(s): CKTOTAL, CKMB, CKMBINDEX, TROPONINI in the last 168  hours. D-Dimer Recent Labs    11/02/19 0340 11/03/19 0126  DDIMER 3.55* 3.47*   BNP: Invalid input(s): POCBNP CBG: Recent Labs  Lab 11/02/19 2059 11/02/19 2333 11/03/19 0357 11/03/19 0744 11/03/19 1133  GLUCAP 182* 147* 217* 114* 142*   Anemia work up Recent Labs    11/02/19 0339 11/03/19 0126  FERRITIN 309* 295   Urinalysis    Component Value Date/Time   COLORURINE YELLOW (A) 10/17/2019 2352   APPEARANCEUR HAZY (A) 10/17/2019 2352   APPEARANCEUR Clear 03/20/2018 0831   LABSPEC 1.027 10/17/2019 2352   LABSPEC 1.019 03/03/2012 1109   PHURINE 5.0 10/17/2019 2352   GLUCOSEU NEGATIVE 10/17/2019 2352   GLUCOSEU Negative 03/03/2012 1109   HGBUR NEGATIVE 10/17/2019 2352   BILIRUBINUR NEGATIVE 10/17/2019 2352   BILIRUBINUR Negative 03/20/2018 0831   BILIRUBINUR Negative 03/03/2012 1109   KETONESUR 20 (A) 10/17/2019 2352   PROTEINUR 100 (A) 10/17/2019 2352   NITRITE POSITIVE (A) 10/17/2019 2352   LEUKOCYTESUR NEGATIVE 10/17/2019 2352   LEUKOCYTESUR Negative 03/03/2012 1109   Sepsis Labs Invalid input(s): PROCALCITONIN,  WBC,  LACTICIDVEN     SIGNED:  Vianne Bulls, MD  [facility] 11/11/19, 3:12 AM Pager   If 7PM-7AM, please contact night-coverage [email or web address] Password [email or web address]

## 2019-11-05 DEATH — deceased

## 2019-11-09 MED FILL — Fentanyl Citrate Preservative Free (PF) Inj 2500 MCG/50ML: INTRAMUSCULAR | Qty: 50 | Status: AC

## 2019-11-09 MED FILL — Sodium Chloride IV Soln 0.9%: INTRAVENOUS | Qty: 250 | Status: AC

## 2021-07-07 IMAGING — DX DG ABD PORTABLE 1V
1 series · 1 of 1 positions shown · non-contrast
Comparison: None.

CLINICAL DATA: NG tube placement

EXAM:
PORTABLE ABDOMEN - 1 VIEW

[abdomen supine]
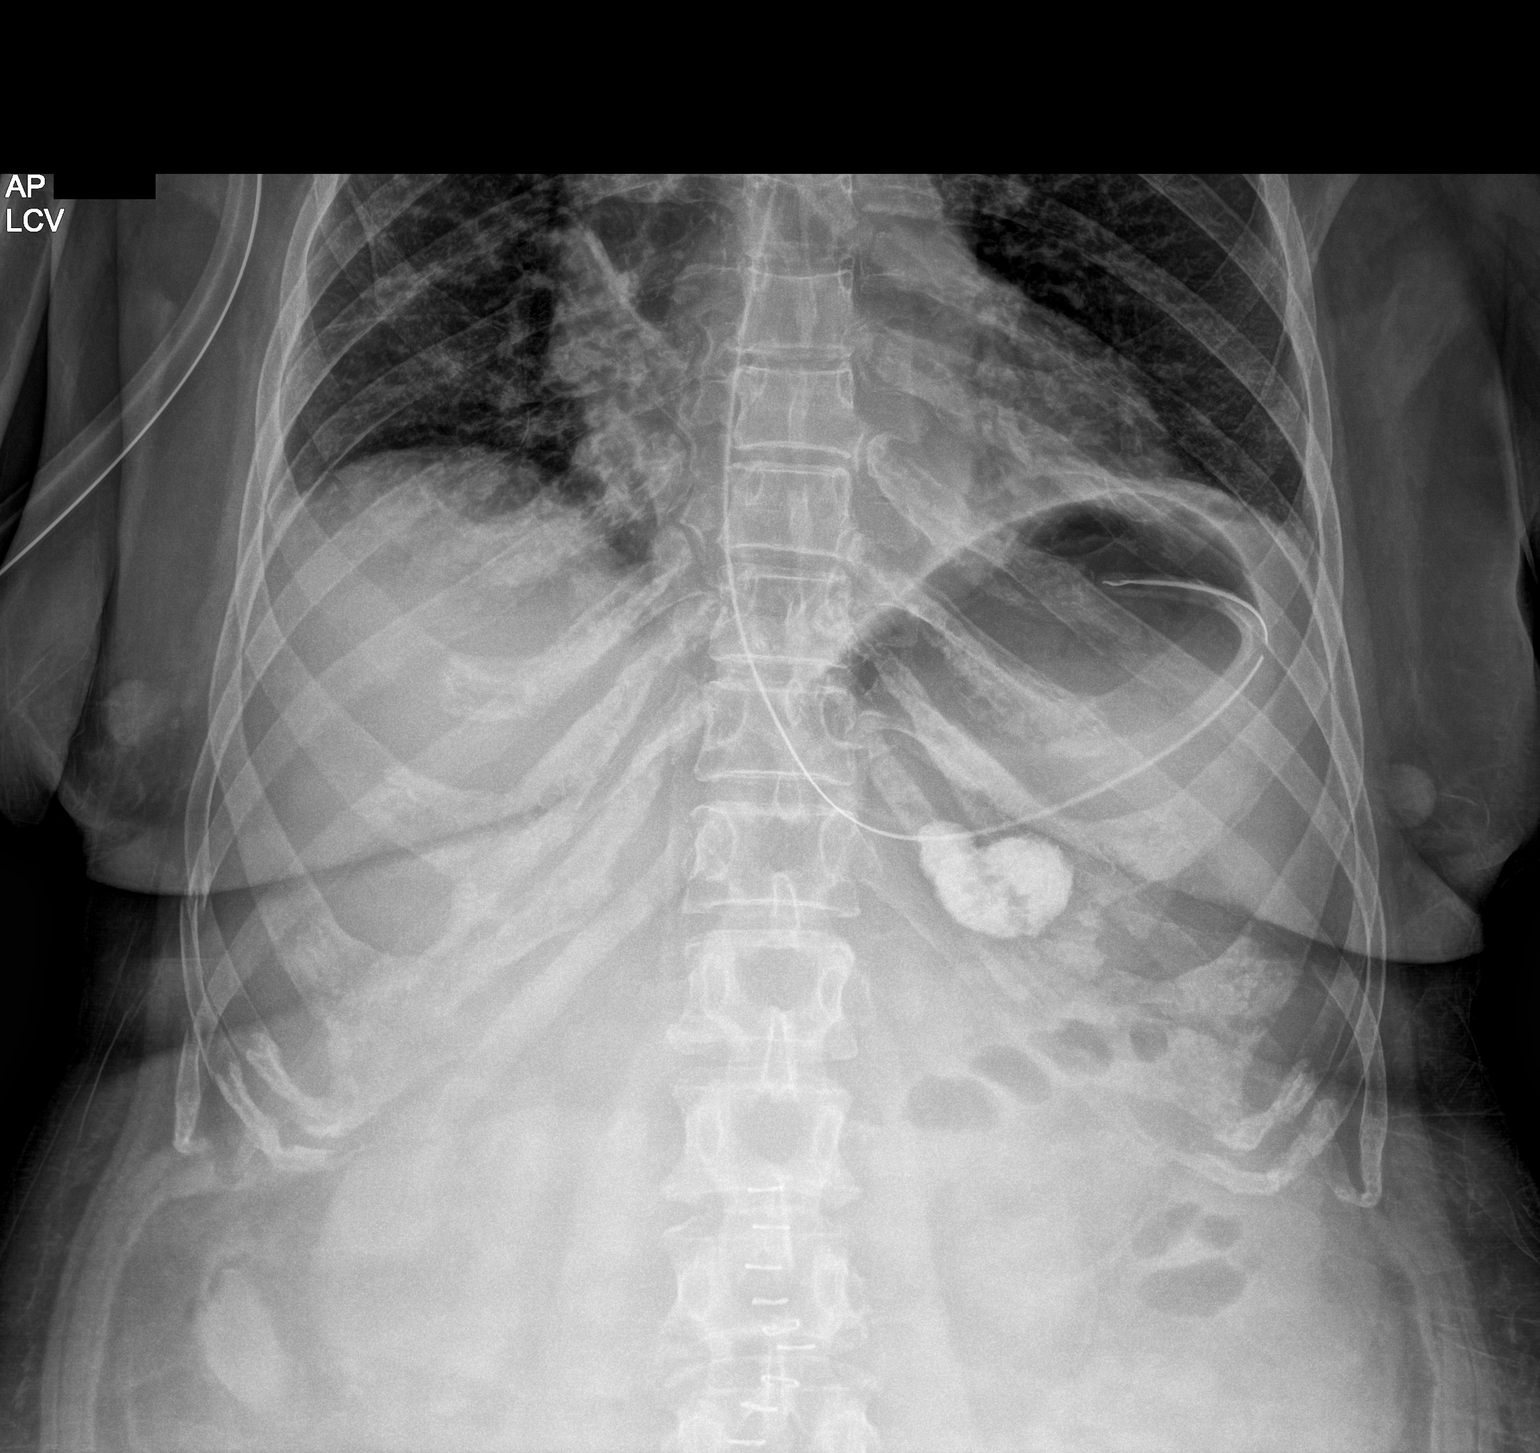

[1 of 1 positions shown; findings below may reference images not displayed]

FINDINGS: The enteric tube projects over the gastric body/fundus. The bowel
gas pattern is nonspecific and nonobstructive. Again noted is a
large calcified structure in the left upper quadrant likely
representing the patient's previously noted calcified thrombosed
splenic artery aneurysm.
IMPRESSION: NG tube projects over the gastric body/fundus.

## 2021-07-07 IMAGING — CR DG CHEST 2V
2 series · 2 of 2 positions shown · non-contrast
Comparison: CT scan 08/17/2019

CLINICAL DATA: Epigastric pain after colon surgery on 08/20/2019.

EXAM:
CHEST - 2 VIEW

[chest pa]
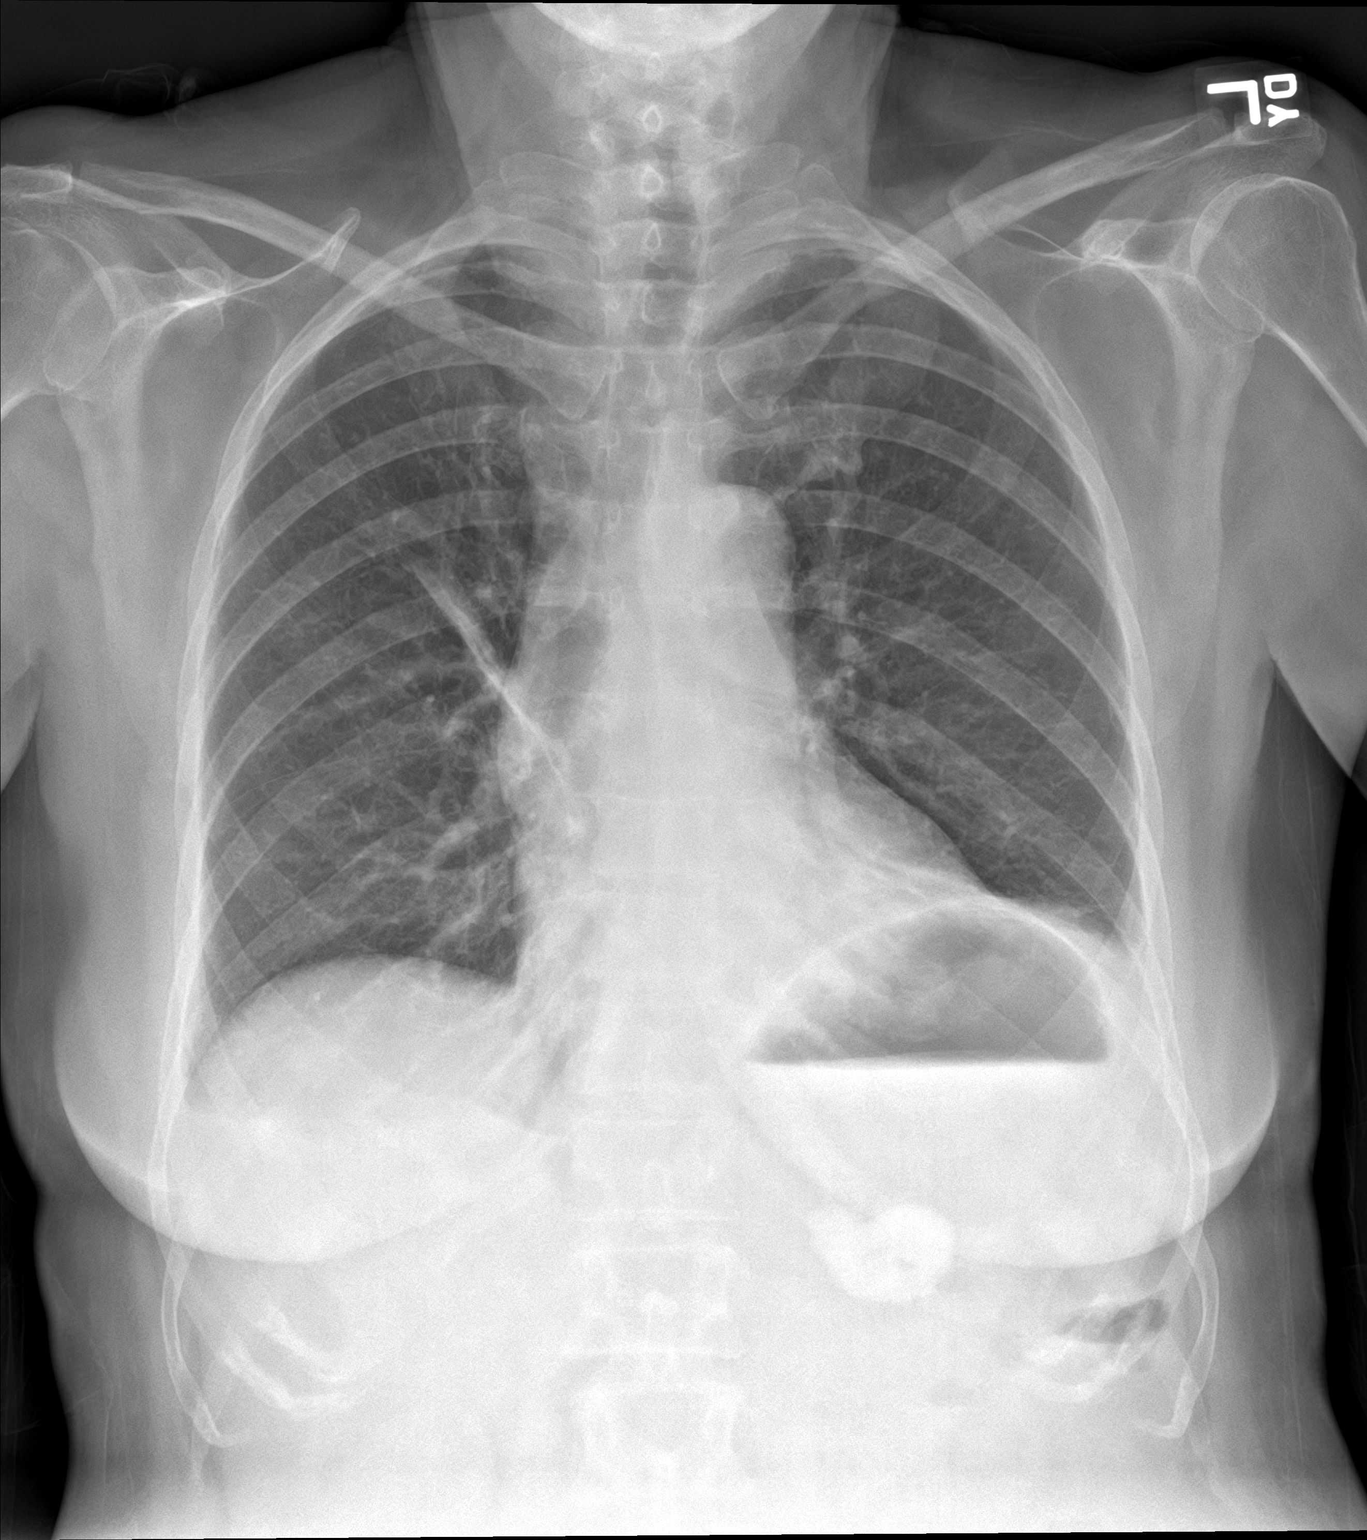

[chest lat]
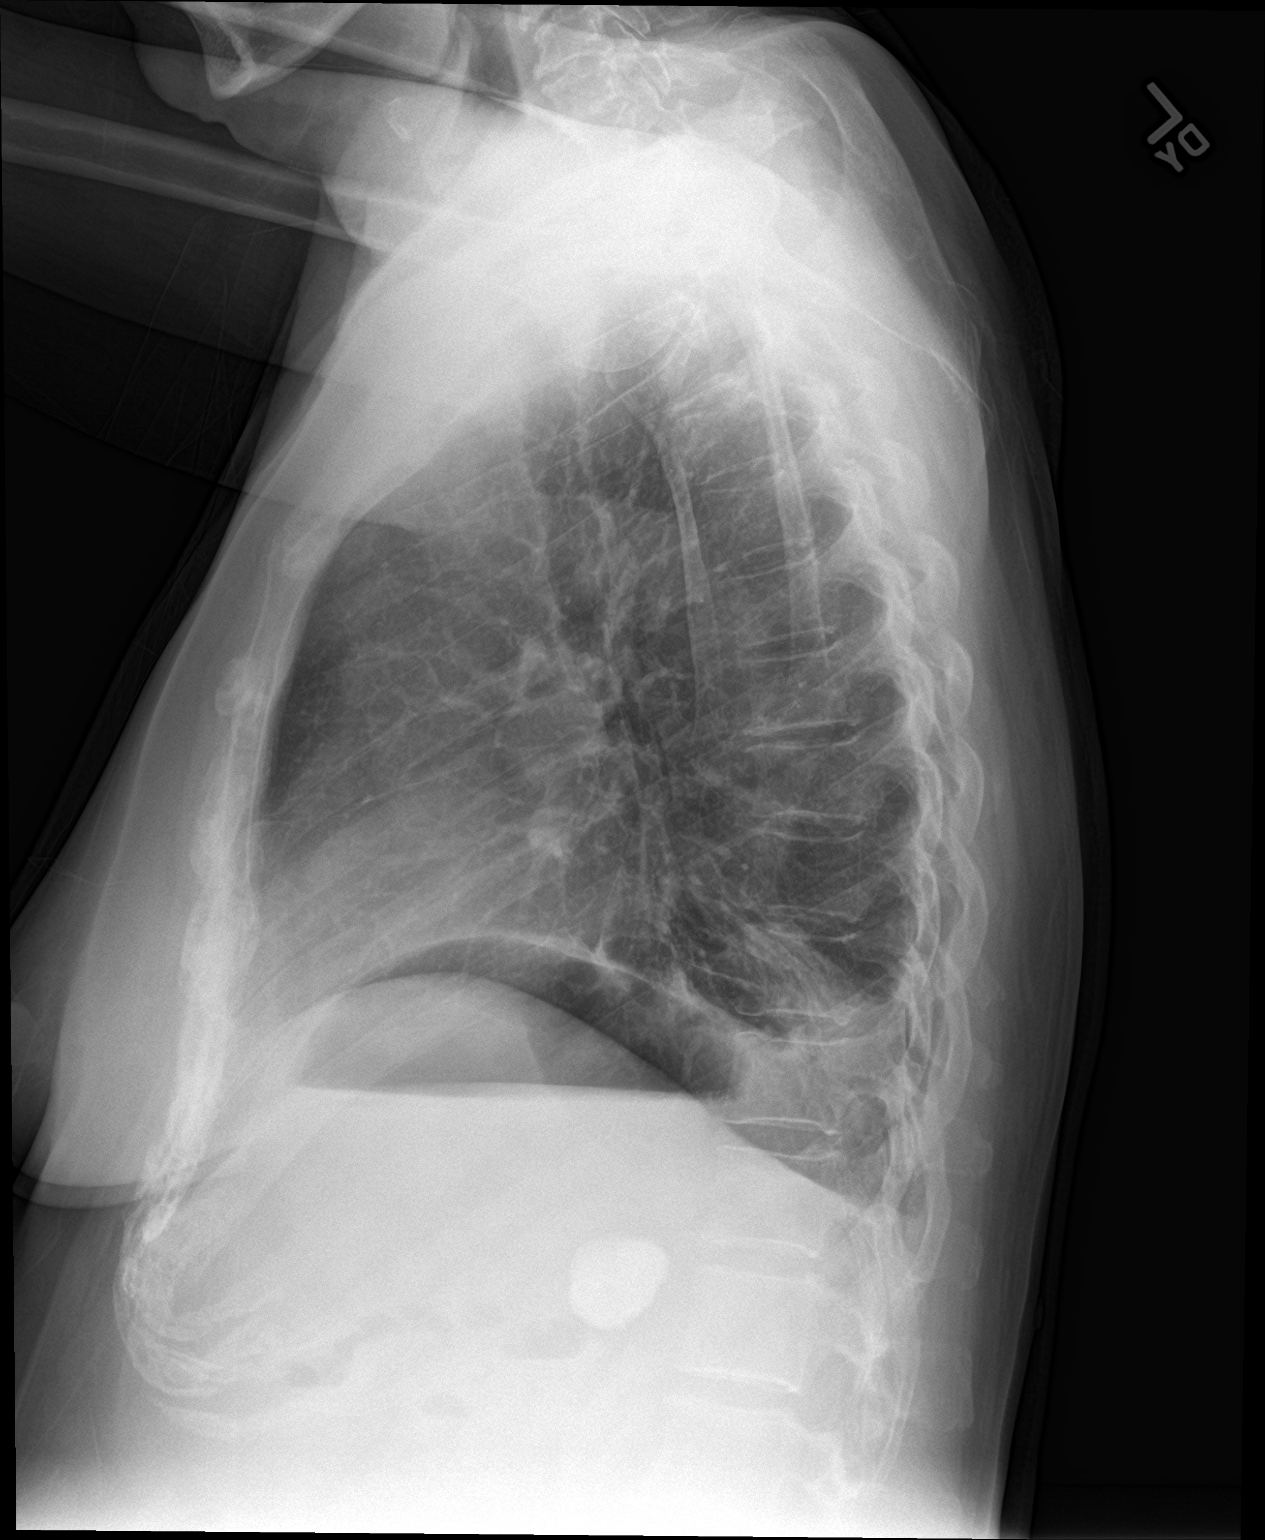

[2 of 2 positions shown; findings below may reference images not displayed]

FINDINGS: Asymmetric elevation left hemidiaphragm. Left base atelectasis noted
with small left pleural effusion. Bandlike opacity in the right
hilar region likely atelectasis or scar. The cardiopericardial
silhouette is within normal limits for size. The visualized bony
structures of the thorax are intact. Densely calcified lesion in the
left upper quadrant was better characterized on recent CT is
positioned posterior to the gastric fundus.
IMPRESSION: 1. Asymmetric elevation left hemidiaphragm with left base
atelectasis and small left pleural effusion.
2. Bandlike opacity in the right hilar region likely atelectasis or
scar.

## 2021-07-09 IMAGING — DX DG ABDOMEN 2V
2 series · 2 of 2 positions shown · non-contrast
Comparison: Radiograph dated 08/25/2019.

CLINICAL DATA: 78-year-old female with small bowel obstruction. NG
placement.

EXAM:
ABDOMEN - 2 VIEW

[abdomen erect]
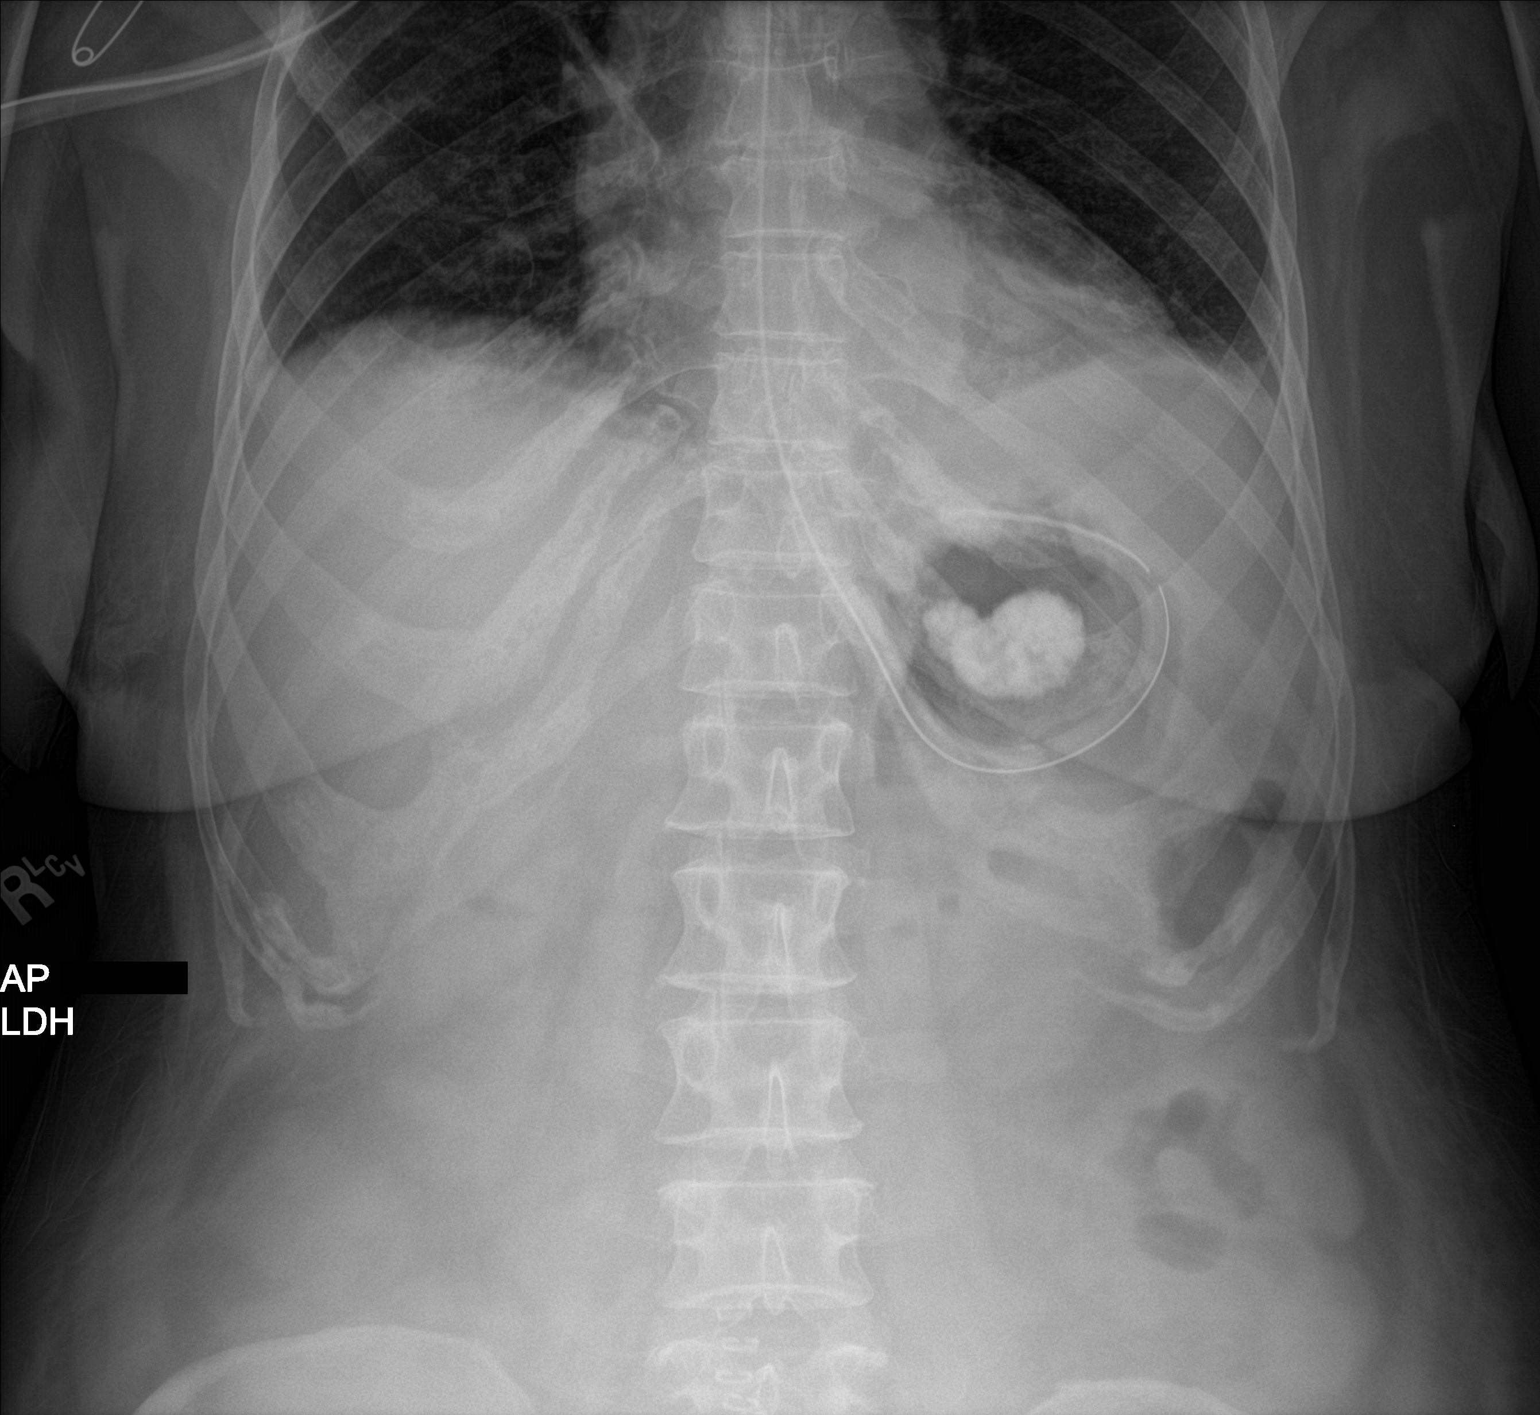

[abdomen supine]
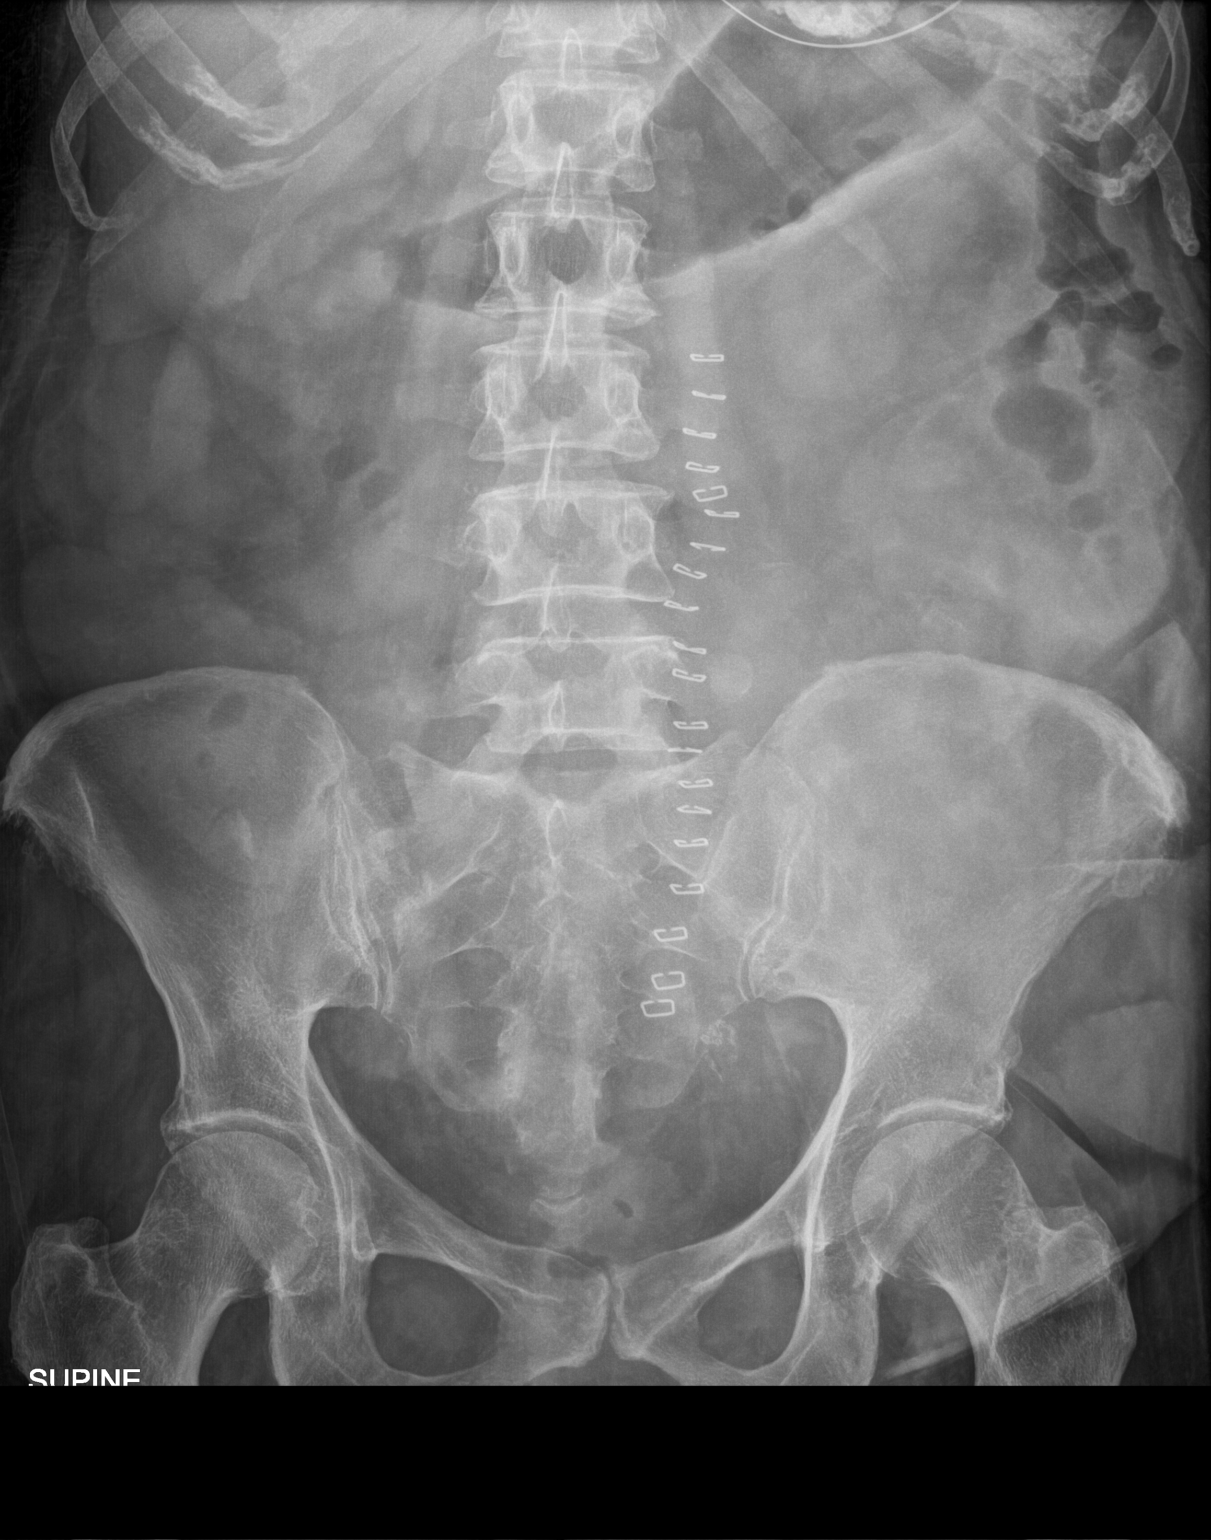

[2 of 2 positions shown; findings below may reference images not displayed]

FINDINGS: Enteric tube with tip and side-port in the stomach. No bowel
dilatation identified. Air is noted in the descending colon.
Calcified structure in the left upper abdomen similar to prior
radiograph corresponding to the calcified thrombosed splenic artery
aneurysm seen on the prior CT. The osseous structures are intact.
Midline anterior abdominal skin staples noted.
IMPRESSION: 1. Enteric tube with tip and side-port in the stomach.
2. No bowel dilatation.
